# Patient Record
Sex: Male | Born: 1980 | Race: White | Hispanic: No | Marital: Married | Smoking: Former smoker
Health system: Southern US, Community
[De-identification: ages and names within clinical notes are randomized; demographics above are authoritative.]

## PROBLEM LIST (undated history)

## (undated) DIAGNOSIS — Z8489 Family history of other specified conditions: Secondary | ICD-10-CM

## (undated) DIAGNOSIS — K219 Gastro-esophageal reflux disease without esophagitis: Secondary | ICD-10-CM

## (undated) DIAGNOSIS — K5792 Diverticulitis of intestine, part unspecified, without perforation or abscess without bleeding: Secondary | ICD-10-CM

## (undated) HISTORY — PX: TONSILECTOMY, ADENOIDECTOMY, BILATERAL MYRINGOTOMY AND TUBES: SHX2538

## (undated) HISTORY — PX: TONSILLECTOMY: SUR1361

## (undated) HISTORY — DX: Diverticulitis of intestine, part unspecified, without perforation or abscess without bleeding: K57.92

---

## 2013-07-17 DIAGNOSIS — J309 Allergic rhinitis, unspecified: Secondary | ICD-10-CM | POA: Insufficient documentation

## 2014-08-15 DIAGNOSIS — M5489 Other dorsalgia: Secondary | ICD-10-CM | POA: Insufficient documentation

## 2014-08-15 DIAGNOSIS — G4700 Insomnia, unspecified: Secondary | ICD-10-CM | POA: Insufficient documentation

## 2015-07-16 ENCOUNTER — Encounter: Payer: Self-pay | Admitting: Emergency Medicine

## 2015-07-16 ENCOUNTER — Emergency Department: Payer: Managed Care, Other (non HMO)

## 2015-07-16 ENCOUNTER — Emergency Department
Admission: EM | Admit: 2015-07-16 | Discharge: 2015-07-16 | Disposition: A | Discharge status: Home / Self Care | Payer: Managed Care, Other (non HMO) | Attending: Emergency Medicine | Admitting: Emergency Medicine

## 2015-07-16 DIAGNOSIS — Y9389 Activity, other specified: Secondary | ICD-10-CM | POA: Diagnosis not present

## 2015-07-16 DIAGNOSIS — S93492A Sprain of other ligament of left ankle, initial encounter: Secondary | ICD-10-CM | POA: Diagnosis not present

## 2015-07-16 DIAGNOSIS — W109XXA Fall (on) (from) unspecified stairs and steps, initial encounter: Secondary | ICD-10-CM | POA: Diagnosis not present

## 2015-07-16 DIAGNOSIS — Y929 Unspecified place or not applicable: Secondary | ICD-10-CM | POA: Insufficient documentation

## 2015-07-16 DIAGNOSIS — Z87891 Personal history of nicotine dependence: Secondary | ICD-10-CM | POA: Insufficient documentation

## 2015-07-16 DIAGNOSIS — S99912A Unspecified injury of left ankle, initial encounter: Secondary | ICD-10-CM | POA: Diagnosis present

## 2015-07-16 DIAGNOSIS — Y999 Unspecified external cause status: Secondary | ICD-10-CM | POA: Diagnosis not present

## 2015-07-16 MED ORDER — MELOXICAM 15 MG PO TABS: 15.0000 mg | ORAL_TABLET | Freq: Every day | ORAL | Status: DC

## 2015-07-16 NOTE — Discharge Instructions (Signed)
Ankle Sprain °An ankle sprain is an injury to the strong, fibrous tissues (ligaments) that hold the bones of your ankle joint together.  °CAUSES °An ankle sprain is usually caused by a fall or by twisting your ankle. Ankle sprains most commonly occur when you step on the outer edge of your foot, and your ankle turns inward. People who participate in sports are more prone to these types of injuries.  °SYMPTOMS  °· Pain in your ankle. The pain may be present at rest or only when you are trying to stand or walk. °· Swelling. °· Bruising. Bruising may develop immediately or within 1 to 2 days after your injury. °· Difficulty standing or walking, particularly when turning corners or changing directions. °DIAGNOSIS  °Your caregiver will ask you details about your injury and perform a physical exam of your ankle to determine if you have an ankle sprain. During the physical exam, your caregiver will press on and apply pressure to specific areas of your foot and ankle. Your caregiver will try to move your ankle in certain ways. An X-ray exam may be done to be sure a bone was not broken or a ligament did not separate from one of the bones in your ankle (avulsion fracture).  °TREATMENT  °Certain types of braces can help stabilize your ankle. Your caregiver can make a recommendation for this. Your caregiver may recommend the use of medicine for pain. If your sprain is severe, your caregiver may refer you to a surgeon who helps to restore function to parts of your skeletal system (orthopedist) or a physical therapist. °HOME CARE INSTRUCTIONS  °· Apply ice to your injury for 1-2 days or as directed by your caregiver. Applying ice helps to reduce inflammation and pain. °· Put ice in a plastic bag. °· Place a towel between your skin and the bag. °· Leave the ice on for 15-20 minutes at a time, every 2 hours while you are awake. °· Only take over-the-counter or prescription medicines for pain, discomfort, or fever as directed by  your caregiver. °· Elevate your injured ankle above the level of your heart as much as possible for 2-3 days. °· If your caregiver recommends crutches, use them as instructed. Gradually put weight on the affected ankle. Continue to use crutches or a cane until you can walk without feeling pain in your ankle. °· If you have a plaster splint, wear the splint as directed by your caregiver. Do not rest it on anything harder than a pillow for the first 24 hours. Do not put weight on it. Do not get it wet. You may take it off to take a shower or bath. °· You may have been given an elastic bandage to wear around your ankle to provide support. If the elastic bandage is too tight (you have numbness or tingling in your foot or your foot becomes cold and blue), adjust the bandage to make it comfortable. °· If you have an air splint, you may blow more air into it or let air out to make it more comfortable. You may take your splint off at night and before taking a shower or bath. Wiggle your toes in the splint several times per day to decrease swelling. °SEEK MEDICAL CARE IF:  °· You have rapidly increasing bruising or swelling. °· Your toes feel extremely cold or you lose feeling in your foot. °· Your pain is not relieved with medicine. °SEEK IMMEDIATE MEDICAL CARE IF: °· Your toes are numb or blue. °·   You have severe pain that is increasing. °MAKE SURE YOU:  °· Understand these instructions. °· Will watch your condition. °· Will get help right away if you are not doing well or get worse. °  °This information is not intended to replace advice given to you by your health care provider. Make sure you discuss any questions you have with your health care provider. °  °Document Released: 03/25/2005 Document Revised: 04/15/2014 Document Reviewed: 04/06/2011 °Elsevier Interactive Patient Education ©2016 [employer] ° °Elastic Bandage and RICE °WHAT DOES AN ELASTIC BANDAGE DO? °Elastic bandages come in different shapes and sizes.  They generally provide support to your injury and reduce swelling while you are healing, but they can perform different functions. Your health care provider will help you to decide what is best for your protection, recovery, or rehabilitation following an injury. °WHAT ARE SOME GENERAL TIPS FOR USING AN ELASTIC BANDAGE? °· Use the bandage as directed by the maker of the bandage that you are using. °· Do not wrap the bandage too tightly. This may cut off the circulation in the arm or leg in the area below the bandage. °· If part of your body beyond the bandage becomes blue, numb, cold, swollen, or is more painful, your bandage is most likely too tight. If this occurs, remove your bandage and reapply it more loosely. °· See your health care provider if the bandage seems to be making your problems worse rather than better. °· An elastic bandage should be removed and reapplied every 3-4 hours or as directed by your health care provider. °WHAT IS RICE? °The routine care of many injuries includes rest, ice, compression, and elevation (RICE therapy).  °Rest °Rest is required to allow your body to heal. Generally, you can resume your routine activities when you are comfortable and have been given permission by your health care provider. °Ice °Icing your injury helps to keep the swelling down and it reduces pain. Do not apply ice directly to your skin. °· Put ice in a plastic bag. °· Place a towel between your skin and the bag. °· Leave the ice on for 20 minutes, 2-3 times per day. °Do this for as long as you are directed by your health care provider. °Compression °Compression helps to keep swelling down, gives support, and helps with discomfort. Compression may be done with an elastic bandage. °Elevation °Elevation helps to reduce swelling and it decreases pain. If possible, your injured area should be placed at or above the level of your heart or the center of your chest. °WHEN SHOULD I SEEK MEDICAL CARE? °You should seek  medical care if: °· You have persistent pain and swelling. °· Your symptoms are getting worse rather than improving. °These symptoms may indicate that further evaluation or further X-rays are needed. Sometimes, X-rays may not show a small broken bone (fracture) until a number of days later. Make a follow-up appointment with your health care provider. Ask when your X-ray results will be ready. Make sure that you get your X-ray results. °WHEN SHOULD I SEEK IMMEDIATE MEDICAL CARE? °You should seek immediate medical care if: °· You have a sudden onset of severe pain at or below the area of your injury. °· You develop redness or increased swelling around your injury. °· You have tingling or numbness at or below the area of your injury that does not improve after you remove the elastic bandage. °  °This information is not intended to replace advice given to you by your   health care provider. Make sure you discuss any questions you have with your health care provider. °  °Document Released: 09/14/2001 Document Revised: 12/14/2014 Document Reviewed: 11/08/2013 °Elsevier Interactive Patient Education ©2016 [employer] ° °Stirrup Ankle Brace °Stirrup ankle braces give support and help stabilize the ankle joint. They are rigid pieces of plastic or fiberglass that go up both sides of the lower leg with the bottom of the stirrup fitting comfortably under the bottom of the instep of the foot. It can be held on with Velcro straps or an elastic wrap. Stirrup ankle braces are used to support the ankle following mild or moderate sprains or strains, or fractures after cast removal.  °They can be easily removed or adjusted if there is swelling. The rigid brace shells are designed to fit the ankle comfortably and provide the needed medial/lateral stabilization. This brace can be easily worn with most athletic shoes. The brace liner is usually made of a soft, comfortable gel-like material. This gel fits the ankle well without causing  uncomfortable pressure points.  °IMPORTANCE OF ANKLE BRACES: °· The use of ankle bracing is effective in the prevention of ankle sprains. °· In athletes, the use of ankle bracing will offer protection and prevent further sprains. °· Research shows that a complete rehabilitation program needs to be included with external bracing. This includes range of motion and ankle strengthening exercises. Your caregivers will instruct you in this. °If you were given the brace today for a new injury, use the following home care instructions as a guide. °HOME CARE INSTRUCTIONS  °· Apply ice to the sore area for 15-20 minutes, 03-04 times per day while awake for the first 2 days. Put the ice in a plastic bag and place a towel between the bag of ice and your skin. Never place the ice pack directly on your skin. Be especially careful using ice on an elbow or knee or other bony area, such as your ankle, because icing for too long may damage the nerves which are close to the surface. °· Keep your leg elevated when possible to lessen swelling. °· Wear your splint until you are seen for a follow-up examination. Do not put weight on it. Do not get it wet. You may take it off to take a shower or bath. °· For Activity: Use crutches with non-weight bearing for 1 week. Then, you may walk on your ankle as instructed. Start gradually with weight bearing on the affected ankle. °· Continue to use crutches or a cane until you can stand on your ankle without causing pain. °· Wiggle your toes in the splint several times per day if you are able. °· The splint is too tight if you have numbness, tingling, or if your foot becomes cold and blue. Adjust the straps or elastic bandage to make it comfortable. °· Only take over-the-counter or prescription medicines for pain, discomfort, or fever as directed by your caregiver. °SEEK IMMEDIATE MEDICAL CARE IF:  °· You have increased bruising, swelling or pain. °· Your toes are blue or cold and loosening the  brace or wrap does not help. °· Your pain is not relieved with medicine. °MAKE SURE YOU:  °· Understand these instructions. °· Will watch your condition. °· Will get help right away if you are not doing well or get worse. °  °This information is not intended to replace advice given to you by your health care provider. Make sure you discuss any questions you have with your health care provider. °  °  Document Released: 01/24/2004 Document Revised: 06/17/2011 Document Reviewed: 10/25/2014 °Elsevier Interactive Patient Education ©2016 Elsevier Inc. ° °

## 2015-07-16 NOTE — ED Notes (Signed)
Pt states Injury to L ankle as he was carrying a box down the steps. Pt states pain to L ankle.

## 2015-07-16 NOTE — ED Provider Notes (Signed)
[facility] Emergency Department Provider Note  ____________________________________________  Time seen: Approximately 4:20 PM  I have reviewed the triage vital signs and the nursing notes.   HISTORY  Chief Complaint Ankle Injury    HPI Walter Coffey is a 35 y.o. male who presents emergency department complaining of left ankle pain. Patient states that he was coming down a flight of stairs carrying a box when he missed a step and hit the ground twisting his ankle in an inversion manner. Patient is complaining of pain to the lateral midfoot.Patient endorses some mild swelling to the lateral ankle. He states that it is painful to walk on. He denies any numbness or tingling in his toes. Patient denies any other injury or complaint at this time. He has taken 800 mg of ibuprofen prior to arrival as well as use ice. Patient states the pain is constant, moderate to severe with weightbearing.   History reviewed. No pertinent past medical history.  There are no active problems to display for this patient.   History reviewed. No pertinent past surgical history.  Current Outpatient Rx  Name  Route  Sig  Dispense  Refill  . meloxicam (MOBIC) 15 MG tablet   Oral   Take 1 tablet (15 mg total) by mouth daily.   30 tablet   0     Allergies Review of patient's allergies indicates no known allergies.  History reviewed. No pertinent family history.  Social History Social History  Substance Use Topics  . Smoking status: Former Games developer  . Smokeless tobacco: Never Used  . Alcohol Use: Yes     Review of Systems  Constitutional: No fever/chills Musculoskeletal:Positive for left ankle pain Skin: Negative for rash. Denies lacerations or abrasions to ankle. Neurological: Negative for headaches, focal weakness or numbness. 10-point ROS otherwise negative.  ____________________________________________   PHYSICAL EXAM:  VITAL SIGNS: ED Triage Vitals  Enc  Vitals Group     BP 07/16/15 1527 132/81 mmHg     Pulse Rate 07/16/15 1527 72     Resp 07/16/15 1527 18     Temp 07/16/15 1527 97.6 F (36.4 C)     Temp Source 07/16/15 1527 Oral     SpO2 07/16/15 1527 96 %     Weight 07/16/15 1527 230 lb (104.327 kg)     Height 07/16/15 1527  (1.803 m)     Head Cir --      Peak Flow --      Pain Score 07/16/15 1528 1     Pain Loc --      Pain Edu? --      Excl. in GC? --      Constitutional: Alert and oriented. Well appearing and in no acute distress. Cardiovascular: Normal rate, regular rhythm. Normal S1 and S2.  Good peripheral circulation. Respiratory: Normal respiratory effort without tachypnea or retractions. Lungs CTAB. Musculoskeletal: No deformity noted to left ankle on inspection. Patient does have edema noted to the anterior lateral aspect of the ankle joint. Patient is tender to palpation over the anterior talofibular ligament. No palpable abnormality. This is diffuse tenderness with no point tenderness. Limited range of motion to ankle due to pain. Dorsalis pedis pulse and sensation intact to foot. Neurologic:  Normal speech and language. No gross focal neurologic deficits are appreciated.  Skin:  Skin is warm, dry and intact. No rash noted. Psychiatric: Mood and affect are normal. Speech and behavior are normal. Patient exhibits appropriate insight and judgement.   ____________________________________________  LABS (all labs ordered are listed, but only abnormal results are displayed)  Labs Reviewed - No data to display ____________________________________________  EKG   ____________________________________________  RADIOLOGY Festus BarrenI, Imanie Darrow D Vasili Fok, personally viewed and evaluated these images (plain radiographs) as part of my medical decision making, as well as reviewing the written report by the radiologist.  Dg Ankle Complete Left  07/16/2015  CLINICAL DATA:  Twisted left ankle. EXAM: LEFT ANKLE COMPLETE - 3+ VIEW  COMPARISON:  None. FINDINGS: There is no evidence of fracture, dislocation, or joint effusion. There is no evidence of arthropathy or other focal bone abnormality. Soft tissues are unremarkable. IMPRESSION: Negative. Electronically Signed   By: Signa Kellaylor  Stroud M.D.   On: 07/16/2015 16:04    ____________________________________________    PROCEDURES  Procedure(s) performed:       Medications - No data to display   ____________________________________________   INITIAL IMPRESSION / ASSESSMENT AND PLAN / ED COURSE  Pertinent labs & imaging results that were available during my care of the patient were reviewed by me and considered in my medical decision making (see chart for details).  Patient's diagnosis is consistent with anterior talofibular ligament ankle sprain. Patient is given crutches and Ace bandage emergency department. He is instructed to use an ASO lace up ankle brace for further support.. Patient will be discharged home with prescriptions for meloxicam. Patient is to follow up with orthopedics if symptoms persist past this treatment course. Patient is given ED precautions to return to the ED for any worsening or new symptoms.     ____________________________________________  FINAL CLINICAL IMPRESSION(S) / ED DIAGNOSES  Final diagnoses:  Sprain of anterior talofibular ligament of left ankle, initial encounter      NEW MEDICATIONS STARTED DURING THIS VISIT:  New Prescriptions   MELOXICAM (MOBIC) 15 MG TABLET    Take 1 tablet (15 mg total) by mouth daily.        This chart was dictated using voice recognition software/Dragon. Despite best efforts to proofread, errors can occur which can change the meaning. Any change was purely unintentional.    Racheal PatchesJonathan D Derrius Furtick, PA-C 07/16/15 1651  Sharman CheekPhillip Stafford, MD 07/17/15 2350

## 2018-05-08 DIAGNOSIS — K219 Gastro-esophageal reflux disease without esophagitis: Secondary | ICD-10-CM | POA: Insufficient documentation

## 2018-10-05 ENCOUNTER — Other Ambulatory Visit: Payer: Self-pay | Admitting: Family Medicine

## 2018-10-05 ENCOUNTER — Ambulatory Visit
Admission: RE | Admit: 2018-10-05 | Discharge: 2018-10-05 | Disposition: A | Discharge status: Home / Self Care | Payer: PRIVATE HEALTH INSURANCE | Source: Ambulatory Visit | Attending: Family Medicine | Admitting: Family Medicine

## 2018-10-05 ENCOUNTER — Other Ambulatory Visit: Payer: Self-pay

## 2018-10-05 DIAGNOSIS — R1031 Right lower quadrant pain: Secondary | ICD-10-CM | POA: Diagnosis present

## 2018-10-05 DIAGNOSIS — R1909 Other intra-abdominal and pelvic swelling, mass and lump: Secondary | ICD-10-CM

## 2018-10-05 MED ORDER — IOHEXOL 300 MG/ML  SOLN
100.0000 mL | Freq: Once | INTRAMUSCULAR | Status: AC | PRN
  Administered 2018-10-05: 100 mL via INTRAVENOUS

## 2018-11-02 ENCOUNTER — Encounter: Payer: Self-pay | Admitting: General Surgery

## 2018-11-02 ENCOUNTER — Ambulatory Visit: Payer: PRIVATE HEALTH INSURANCE | Admitting: General Surgery

## 2018-11-02 ENCOUNTER — Other Ambulatory Visit: Payer: Self-pay

## 2018-11-02 VITALS — BP 143/105 | HR 85 | Temp 97.9°F | Resp 18 | Ht 71.0 in | Wt 229.2 lb

## 2018-11-02 DIAGNOSIS — K4090 Unilateral inguinal hernia, without obstruction or gangrene, not specified as recurrent: Secondary | ICD-10-CM | POA: Insufficient documentation

## 2018-11-02 NOTE — Progress Notes (Signed)
Patient ID: Walter Coffey, male   DOB: 17-Jun-1980, 37 y.o.   MRN: [id number]  Chief Complaint  Patient presents with  . New Patient (Initial Visit)    Right Inguinal Hernia    HPI Walter Coffey is a 38 y.o. male.   He has been referred by his primary care provider, Dr. Frazier Richards, for further evaluation of a right inguinal hernia.  Walter Coffey states that he has noticed some discomfort in his right groin since about January.  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  In June, he had significantly worsening pain accompanied by loose stool.  He underwent a CT scan that showed diverticulitis.  He was treated with oral antibiotics.  The CT scan also demonstrated a right inguinal hernia.  He was referred to general surgery and saw Dr. Peyton Najjar at the [place] [facility].  Walter Coffey states that he did not get "warm fuzzies" from his interaction there and wanted to seek a second opinion.  He states that he has not noticed a bulge in his groin.  He has not had any nausea or vomiting.  No symptoms of obstruction, incarceration, or strangulation.   Past Medical History:  Diagnosis Date  . Diverticulitis     Past Surgical History:  Procedure Laterality Date  . TONSILECTOMY, ADENOIDECTOMY, BILATERAL MYRINGOTOMY AND TUBES      Family History  Problem Relation Age of Onset  . Breast cancer Mother   . Diabetes Mother   . Severe combined immunodeficiency Father     Social History Social History   Tobacco Use  . Smoking status: Former Smoker    Quit date: 2004    Years since quitting: 16.5  . Smokeless tobacco: Never Used  Substance Use Topics  . Alcohol use: Yes  . Drug use: No    No Known Allergies  Current Outpatient Medications  Medication Sig Dispense Refill  . pantoprazole (PROTONIX) 40 MG tablet Take by mouth.     No current facility-administered medications for this visit.     Review of  Systems Review of Systems  All other systems reviewed and are negative.   Blood pressure (!) 143/105, pulse 85, temperature 97.9 F (36.6 C), temperature source Temporal, resp. rate 18, height 5' 11"  (1.803 m), weight 229 lb 3.2 oz (104 kg), SpO2 92 %.  Physical Exam Physical Exam Vitals signs reviewed. Exam conducted with a chaperone present.  Constitutional:      General: He is not in acute distress.    Appearance: Normal appearance. He is obese.  HENT:     Head: Normocephalic and atraumatic.     Nose:     Comments: Covered with a mask secondary to COVID-19 precautions    Mouth/Throat:     Comments: Covered with a mask secondary to COVID-19 precautions Eyes:     General: No scleral icterus.       Right eye: No discharge.        Left eye: No discharge.  Neck:     Musculoskeletal: Normal range of motion.     Comments: No thyromegaly or thyroid masses palpated Cardiovascular:     Rate and Rhythm: Normal rate and regular rhythm.     Pulses: Normal pulses.  Pulmonary:     Effort: Pulmonary effort is normal.     Breath sounds: Normal breath sounds.  Abdominal:     General: Bowel sounds are normal. There is no  distension.     Palpations: Abdomen is soft.  Genitourinary:      Comments: Easily reducible right inguinal hernia present. Musculoskeletal: Normal range of motion.        General: No swelling.  Lymphadenopathy:     Cervical: No cervical adenopathy.  Skin:    General: Skin is warm and dry.  Neurological:     General: No focal deficit present.     Mental Status: He is alert and oriented to person, place, and time.  Psychiatric:        Mood and Affect: Mood normal.     Data Reviewed I personally reviewed the patient's CT scan.  I agree with the radiologist interpretation regarding a fat-containing right inguinal hernia.  He also did have acute diverticulitis on the scan.    Assessment and Plan: This is a 38 year old male who works in the [employer]  He has had an increasingly symptomatic right inguinal hernia.  He has already met with another surgeon and actually has surgery scheduled, however he sought a second opinion.  He is very concerned about the possibility of an open operation.  I told him that I do not routinely perform laparoscopic hernia repairs.  In light of his recent diverticulitis, as well, this may not be the best time to approach this from a transabdominal approach.  One of my partners has more experience with the total extraperitoneal approach.  Walter Coffey is familiar with Dr. Dahlia Byes as his wife underwent an appendectomy under his care.  We will arrange a consultation between Walter Coffey and Dr. Dahlia Byes to discuss any additional surgical options.  I will see him on an as-needed basis     Walter Coffey 11/02/2018, 5:09 PM

## 2018-11-02 NOTE — Patient Instructions (Addendum)
Please see your follow up appointment listed below.    Inguinal Hernia, Adult An inguinal hernia is when fat or your intestines push through a weak spot in a muscle where your leg meets your lower belly (groin). This causes a rounded lump (bulge). This kind of hernia could also be:  In your scrotum, if you are male.  In folds of skin around your vagina, if you are male. There are three types of inguinal hernias. These include:  Hernias that can be pushed back into the belly (are reducible). This type rarely causes pain.  Hernias that cannot be pushed back into the belly (are incarcerated).  Hernias that cannot be pushed back into the belly and lose their blood supply (are strangulated). This type needs emergency surgery. If you do not have symptoms, you may not need treatment. If you have symptoms or a large hernia, you may need surgery. Follow these instructions at home: Lifestyle  Do these things if told by your doctor so you do not have trouble pooping (constipation): ? Drink enough fluid to keep your pee (urine) pale yellow. ? Eat foods that have a lot of fiber. These include fresh fruits and vegetables, whole grains, and beans. ? Limit foods that are high in fat and processed sugars. These include foods that are fried or sweet. ? Take medicine for trouble pooping.  Avoid lifting heavy objects.  Avoid standing for long amounts of time.  Do not use any products that contain nicotine or tobacco. These include cigarettes and e-cigarettes. If you need help quitting, ask your doctor.  Stay at a healthy weight. General instructions  You may try to push your hernia in by very gently pressing on it when you are lying down. Do not try to force the bulge back in if it will not push in easily.  Watch your hernia for any changes in shape, size, or color. Tell your doctor if you see any changes.  Take over-the-counter and prescription medicines only as told by your doctor.  Keep all  follow-up visits as told by your doctor. This is important. Contact a doctor if:  You have a fever.  You have new symptoms.  Your symptoms get worse. Get help right away if:  The area where your leg meets your lower belly has: ? Pain that gets worse suddenly. ? A bulge that gets bigger suddenly, and it does not get smaller after that. ? A bulge that turns red or purple. ? A bulge that is painful when you touch it.  You are a man, and your scrotum: ? Suddenly feels painful. ? Suddenly changes in size.  You cannot push the hernia in by very gently pressing on it when you are lying down. Do not try to force the bulge back in if it will not push in easily.  You feel sick to your stomach (nauseous), and that feeling does not go away.  You throw up (vomit), and that keeps happening.  You have a fast heartbeat.  You cannot poop (have a bowel movement) or pass gas. These symptoms may be an emergency. Do not wait to see if the symptoms will go away. Get medical help right away. Call your local emergency services (911 in the [place] Summary  An inguinal hernia is when fat or your intestines push through a weak spot in a muscle where your leg meets your lower belly (groin). This causes a rounded lump (bulge).  If you do not have symptoms, you may not  need treatment. If you have symptoms or a large hernia, you may need surgery.  Avoid lifting heavy objects. Also avoid standing for long amounts of time.  Do not try to force the bulge back in if it will not push in easily. This information is not intended to replace advice given to you by your health care provider. Make sure you discuss any questions you have with your health care provider. Document Released: 04/25/2006 Document Revised: 04/26/2017 Document Reviewed: 12/25/2016 Elsevier Patient Education  2020 [employer].

## 2018-11-03 ENCOUNTER — Ambulatory Visit: Payer: PRIVATE HEALTH INSURANCE | Admitting: General Surgery

## 2018-11-11 ENCOUNTER — Ambulatory Visit (INDEPENDENT_AMBULATORY_CARE_PROVIDER_SITE_OTHER): Payer: PRIVATE HEALTH INSURANCE | Admitting: Surgery

## 2018-11-11 ENCOUNTER — Encounter: Payer: Self-pay | Admitting: Surgery

## 2018-11-11 ENCOUNTER — Encounter: Payer: Self-pay | Admitting: *Deleted

## 2018-11-11 ENCOUNTER — Other Ambulatory Visit: Payer: Self-pay

## 2018-11-11 VITALS — BP 136/97 | HR 72 | Temp 97.5°F | Resp 18 | Ht 71.0 in | Wt 226.6 lb

## 2018-11-11 DIAGNOSIS — K4090 Unilateral inguinal hernia, without obstruction or gangrene, not specified as recurrent: Secondary | ICD-10-CM | POA: Diagnosis not present

## 2018-11-11 NOTE — Progress Notes (Signed)
Patient was seen by Dr. Dahlia Byes in the office today and has discussed robotic inguinal hernia repair.   The patient wishes to discuss this with his wife and call the office back to schedule. He is thinking maybe having this done in September.   Patient was provided with phone interview and COVID testing instructions today.   We will await patient's call regarding surgery scheduling.

## 2018-11-12 ENCOUNTER — Encounter: Payer: Self-pay | Admitting: Surgery

## 2018-11-12 NOTE — Progress Notes (Signed)
Patient ID: Walter Coffey, male   DOB: 30-Sep-1980, 38 y.o.   MRN: [id number]  HPI Walter Coffey is a 38 y.o. male with a known history of a right inguinal hernia that is been present for 7 months or so.  He reports intermittent right inguinal discomfort/pain that is mild to moderate intensity, dull and worsening with Valsalva.  He actually reports that he first felt when he was lifting some equipment and cases of water.  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  He recently had an episode of diverticulitis and at that time a CT scan of the abdomen pelvis was performed.  I personally reviewed there is no evidence of perforation or abscess but there is some diverticulitis and there is also evidence of a right inguinal hernia. Normal CBC and BMP He states that his diverticulitis is completely clear and he does not have any left lower quadrant pain anymore.  He is very interested in a robotic assisted laparoscopic hernia repair   HPI  Past Medical History:  Diagnosis Date  . Diverticulitis     Past Surgical History:  Procedure Laterality Date  . TONSILECTOMY, ADENOIDECTOMY, BILATERAL MYRINGOTOMY AND TUBES      Family History  Problem Relation Age of Onset  . Breast cancer Mother   . Diabetes Mother   . Severe combined immunodeficiency Father     Social History Social History   Tobacco Use  . Smoking status: Former Smoker    Quit date: 2004    Years since quitting: 16.6  . Smokeless tobacco: Never Used  Substance Use Topics  . Alcohol use: Yes  . Drug use: No    No Known Allergies  Current Outpatient Medications  Medication Sig Dispense Refill  . pantoprazole (PROTONIX) 40 MG tablet Take by mouth.     No current facility-administered medications for this visit.      Review of Systems Full ROS  was asked and was negative except for the information on the HPI  Physical Exam Blood pressure (!) 136/97, pulse 72, temperature (!) 97.5 F (36.4 C), temperature source  Temporal, resp. rate 18, height 5\' 11"  (1.803 m), weight 226 lb 9.6 oz (102.8 kg), SpO2 97 %. CONSTITUTIONAL: NAD EYES: Pupils are equal, round, and reactive to light, Sclera are non-icteric. EARS, NOSE, MOUTH AND THROAT: The oropharynx is clear. The oral mucosa is pink and moist. Hearing is intact to voice. LYMPH NODES:  Lymph nodes in the neck are normal. RESPIRATORY:  Lungs are clear. There is normal respiratory effort, with equal breath sounds bilaterally, and without pathologic use of accessory muscles. CARDIOVASCULAR: Heart is regular without murmurs, gallops, or rubs. GI: The abdomen is  soft, nontender, and nondistended. There are no palpable masses. There is no hepatosplenomegaly. There are normal bowel sounds in all quadrants. Reducible right inguinal hernia without peritonitis GU: Rectal deferred.   MUSCULOSKELETAL: Normal muscle strength and tone. No cyanosis or edema.   SKIN: Turgor is good and there are no pathologic skin lesions or ulcers. NEUROLOGIC: Motor and sensation is grossly normal. Cranial nerves are grossly intact. PSYCH:  Oriented to person, place and time. Affect is normal.  Data Reviewed  I have personally reviewed the patient's imaging, laboratory findings and medical records.    Assessment/Plan 38 year old male with a symptomatic right inguinal hernia.  I definitely recommend repair.  We had an extensive discussion about different approaches for inguinal hernia repair including laparoscopic T AP, laparoscopic TEP, open hernia repair and robotic assisted T  AP repair. Had an extensive discussion and educational conversation with the patient regarding different approaches and rationale for each method.  I understand that he did have episode of diverticulitis more than a month ago but that does not necessarily preclude a laparoscopic approach.  I did explain to him that as long as the diverticulitis was not acute he will be reasonable to attempt a robotic T AP repair.   He is willing to proceed and currently he wants to do there is approximately in September which will give us much more time to make sure that the diverticulitis has cleared.  Time spent with the patient was 45 minutes, with more than 50% of the time spent in face-to-face education, counseling and care coordination.     Sterling Bigiego Pabon, MD FACS General Surgeon 11/12/2018, 1:07 PM

## 2018-11-24 ENCOUNTER — Other Ambulatory Visit: Payer: PRIVATE HEALTH INSURANCE

## 2018-11-26 ENCOUNTER — Other Ambulatory Visit: Admission: RE | Admit: 2018-11-26 | Payer: PRIVATE HEALTH INSURANCE | Source: Ambulatory Visit

## 2018-11-30 ENCOUNTER — Ambulatory Visit: Admit: 2018-11-30 | Payer: BLUE CROSS/BLUE SHIELD | Admitting: General Surgery

## 2018-11-30 SURGERY — REPAIR, HERNIA, INGUINAL, ADULT
Anesthesia: General | Laterality: Right

## 2019-04-14 ENCOUNTER — Encounter: Payer: Self-pay | Admitting: Surgery

## 2019-04-14 ENCOUNTER — Other Ambulatory Visit: Payer: Self-pay

## 2019-04-14 ENCOUNTER — Ambulatory Visit (INDEPENDENT_AMBULATORY_CARE_PROVIDER_SITE_OTHER): Payer: PRIVATE HEALTH INSURANCE | Admitting: Surgery

## 2019-04-14 VITALS — BP 157/111 | HR 70 | Temp 97.3°F | Ht 71.5 in | Wt 225.4 lb

## 2019-04-14 DIAGNOSIS — K4090 Unilateral inguinal hernia, without obstruction or gangrene, not specified as recurrent: Secondary | ICD-10-CM | POA: Diagnosis not present

## 2019-04-14 DIAGNOSIS — K5732 Diverticulitis of large intestine without perforation or abscess without bleeding: Secondary | ICD-10-CM

## 2019-04-14 NOTE — Progress Notes (Signed)
Outpatient Surgical Follow Up  04/14/2019  Walter Coffey is an 39 y.o. male.   Chief Complaint  Patient presents with  . Follow-up    discuss surgery for inguinal hernia repair for mid jan.    HPI: Walter Coffey is a 39 year old male well-known to me with prior history of symptomatic right inguinal hernia.  He is supposed to have this done last few months but due to the pandemic and other personal circumstances he decided to hold off.  In the interim he has developed 2 episodes of noncomplicated diverticulitis that were able to subside spontaneously after 2 days of bowel rest and hydration.  Did not require antibiotics and he did not require admissions.  He did have prior episode that occurred on June last year and a CT scan was performed at that time.  I have personally reviewed and there was evidence of some diverticulitis without complications.  He describes that his pain from his diverticulitis is mainly in the suprapubic area and interferes with some urination.  Currently the patient is symptom-free.  He has tried fiber and some diet modifications.  He does have a history of an aunt with diverticulitis.  He wants to discuss further about the diverticulitis as well as his inguinal hernia.  He denies any recent fevers chills no Covid exposures no pulmonary symptoms no weight loss.  Past Medical History:  Diagnosis Date  . Diverticulitis     Past Surgical History:  Procedure Laterality Date  . TONSILECTOMY, ADENOIDECTOMY, BILATERAL MYRINGOTOMY AND TUBES      Family History  Problem Relation Age of Onset  . Breast cancer Mother   . Diabetes Mother   . Severe combined immunodeficiency Father     Social History:  reports that he quit smoking about 17 years ago. He has never used smokeless tobacco. He reports current alcohol use. He reports that he does not use drugs.  Allergies: No Known Allergies  Medications reviewed.    ROS Full ROS performed and is otherwise negative other than  what is stated in HPI   BP (!) 157/111   Pulse 70   Temp (!) 97.3 F (36.3 C) (Temporal)   Ht 5' 11.5" (1.816 m)   Wt 225 lb 6.4 oz (102.2 kg)   SpO2 97%   BMI 31.00 kg/m   Physical Exam Vitals and nursing note reviewed. Exam conducted with a chaperone present.  Constitutional:      General: He is not in acute distress.    Appearance: He is normal weight.  Eyes:     General: No scleral icterus.       Right eye: No discharge.        Left eye: No discharge.  Cardiovascular:     Rate and Rhythm: Normal rate and regular rhythm.  Pulmonary:     Effort: Pulmonary effort is normal. No respiratory distress.     Breath sounds: Normal breath sounds. No stridor.  Abdominal:     General: Bowel sounds are normal. There is no distension.     Palpations: Abdomen is soft.     Tenderness: There is no abdominal tenderness. There is no guarding.     Hernia: A hernia is present.     Comments: Reducible right inguinal hernia mildly tender to palpation.  No peritonitis  Musculoskeletal:        General: No swelling. Normal range of motion.     Cervical back: Normal range of motion and neck supple. No rigidity.  Skin:  General: Skin is warm and dry.     Capillary Refill: Capillary refill takes less than 2 seconds.  Neurological:     General: No focal deficit present.     Mental Status: He is alert and oriented to person, place, and time.  Psychiatric:        Mood and Affect: Mood normal.        Behavior: Behavior normal.        Thought Content: Thought content normal.        Judgment: Judgment normal.     Assessment/Plan: 39 year old male with symptomatic right inguinal hernia in need for repair.  I do think that he is a good candidate approach.  He understands that if we find a contralateral defect we will go ahead and fix it in the same operative setting.  Regarding diverticulitis it seems that he had recurrent noncomplicated episodes.  Discussed with him about the importance of  high-fiber diet and hydration.  I do not think that he needs any imaging studies at this time or any other further work-up.  He understands that if he had an acute attack we may have to perform another imaging study.  Schedule his inguinal hernia at his convenience.  He is trying to get also the Covid vaccination and will like to have that before surgery scheduled.  Procedure discussed with the patient once again.  Risk benefit of possible medications he understands and wished to proceed.   Greater than 50% of the 40 minutes  visit was spent in counseling/coordination of care   Sterling Big, MD [facility] General Surgeon

## 2019-04-14 NOTE — Patient Instructions (Addendum)
Our surgery scheduler will contact you within the next 24-48 hours to discuss prep for surgery and to discuss the date of surgery. Please have the BLUE sheet available when our surgery scheduler contacts you. If you have any questions or concerns, please do not hesitate to contact our office.   Diverticulitis  Diverticulitis is infection or inflammation of small pouches (diverticula) in the colon that form due to a condition called diverticulosis. Diverticula can trap stool (feces) and bacteria, causing infection and inflammation. Diverticulitis may cause severe stomach pain and diarrhea. It may lead to tissue damage in the colon that causes bleeding. The diverticula may also burst (rupture) and cause infected stool to enter other areas of the abdomen. Complications of diverticulitis can include:  Bleeding.  Severe infection.  Severe pain.  Rupture (perforation) of the colon.  Blockage (obstruction) of the colon. What are the causes? This condition is caused by stool becoming trapped in the diverticula, which allows bacteria to grow in the diverticula. This leads to inflammation and infection. What increases the risk? You are more likely to develop this condition if:  You have diverticulosis. The risk for diverticulosis increases if: ? You are overweight or obese. ? You use tobacco products. ? You do not get enough exercise.  You eat a diet that does not include enough fiber. High-fiber foods include fruits, vegetables, beans, nuts, and whole grains. What are the signs or symptoms? Symptoms of this condition may include:  Pain and tenderness in the abdomen. The pain is normally located on the left side of the abdomen, but it may occur in other areas.  Fever and chills.  Bloating.  Cramping.  Nausea.  Vomiting.  Changes in bowel routines.  Blood in your stool. How is this diagnosed? This condition is diagnosed based on:  Your medical history.  A physical  exam.  Tests to make sure there is nothing else causing your condition. These tests may include: ? Blood tests. ? Urine tests. ? Imaging tests of the abdomen, including X-rays, ultrasounds, MRIs, or CT scans. How is this treated? Most cases of this condition are mild and can be treated at home. Treatment may include:  Taking over-the-counter pain medicines.  Following a clear liquid diet.  Taking antibiotic medicines by mouth.  Rest. More severe cases may need to be treated at a hospital. Treatment may include:  Not eating or drinking.  Taking prescription pain medicine.  Receiving antibiotic medicines through an IV tube.  Receiving fluids and nutrition through an IV tube.  Surgery. When your condition is under control, your health care provider may recommend that you have a colonoscopy. This is an exam to look at the entire large intestine. During the exam, a lubricated, bendable tube is inserted into the anus and then passed into the rectum, colon, and other parts of the large intestine. A colonoscopy can show how severe your diverticula are and whether something else may be causing your symptoms. Follow these instructions at home: Medicines  Take over-the-counter and prescription medicines only as told by your health care provider. These include fiber supplements, probiotics, and stool softeners.  If you were prescribed an antibiotic medicine, take it as told by your health care provider. Do not stop taking the antibiotic even if you start to feel better.  Do not drive or use heavy machinery while taking prescription pain medicine. General instructions   Follow a full liquid diet or another diet as directed by your health care provider. After your symptoms improve,  your health care provider may tell you to change your diet. He or she may recommend that you eat a diet that contains at least 25 g (25 grams) of fiber daily. Fiber makes it easier to pass stool. Healthy sources  of fiber include: ? Berries. One cup contains 4-8 grams of fiber. ? Beans or lentils. One half cup contains 5-8 grams of fiber. ? Green vegetables. One cup contains 4 grams of fiber.  Exercise for at least 30 minutes, 3 times each week. You should exercise hard enough to raise your heart rate and break a sweat.  Keep all follow-up visits as told by your health care provider. This is important. You may need a colonoscopy. Contact a health care provider if:  Your pain does not improve.  You have a hard time drinking or eating food.  Your bowel movements do not return to normal. Get help right away if:  Your pain gets worse.  Your symptoms do not get better with treatment.  Your symptoms suddenly get worse.  You have a fever.  You vomit more than one time.  You have stools that are bloody, black, or tarry. Summary  Diverticulitis is infection or inflammation of small pouches (diverticula) in the colon that form due to a condition called diverticulosis. Diverticula can trap stool (feces) and bacteria, causing infection and inflammation.  You are at higher risk for this condition if you have diverticulosis and you eat a diet that does not include enough fiber.  Most cases of this condition are mild and can be treated at home. More severe cases may need to be treated at a hospital.  When your condition is under control, your health care provider may recommend that you have an exam called a colonoscopy. This exam can show how severe your diverticula are and whether something else may be causing your symptoms. This information is not intended to replace advice given to you by your health care provider. Make sure you discuss any questions you have with your health care provider. Document Revised: 03/07/2017 Document Reviewed: 04/27/2016 Elsevier Patient Education  2020 [employer]    Inguinal Hernia, Adult An inguinal hernia develops when fat or the intestines push through a  weak spot in a muscle where your leg meets your lower abdomen (groin). This creates a bulge. This kind of hernia could also be:  In your scrotum, if you are male.  In folds of skin around your vagina, if you are male. There are three types of inguinal hernias:  Hernias that can be pushed back into the abdomen (are reducible). This type rarely causes pain.  Hernias that are not reducible (are incarcerated).  Hernias that are not reducible and lose their blood supply (are strangulated). This type of hernia requires emergency surgery. What are the causes? This condition is caused by having a weak spot in the muscles or tissues in the groin. This weak spot develops over time. The hernia may poke through the weak spot when you suddenly strain your lower abdominal muscles, such as when you:  Lift a heavy object.  Strain to have a bowel movement. Constipation can lead to straining.  Cough. What increases the risk? This condition is more likely to develop in:  Men.  Pregnant women.  People who: ? Are overweight. ? Work in jobs that require long periods of standing or heavy lifting. ? Have had an inguinal hernia before. ? Smoke or have lung disease. These factors can lead to long-lasting (chronic) coughing.  What are the signs or symptoms? Symptoms may depend on the size of the hernia. Often, a small inguinal hernia has no symptoms. Symptoms of a larger hernia may include:  A lump in the groin area. This is easier to see when standing. It might not be visible when lying down.  Pain or burning in the groin. This may get worse when lifting, straining, or coughing.  A dull ache or a feeling of pressure in the groin.  In men, an unusual lump in the scrotum. Symptoms of a strangulated inguinal hernia may include:  A bulge in your groin that is very painful and tender to the touch.  A bulge that turns red or purple.  Fever, nausea, and vomiting.  Inability to have a bowel movement  or to pass gas. How is this diagnosed? This condition is diagnosed based on your symptoms, your medical history, and a physical exam. Your health care provider may feel your groin area and ask you to cough. How is this treated? Treatment depends on the size of your hernia and whether you have symptoms. If you do not have symptoms, your health care provider may have you watch your hernia carefully and have you come in for follow-up visits. If your hernia is large or if you have symptoms, you may need surgery to repair the hernia. Follow these instructions at home: Lifestyle  Avoid lifting heavy objects.  Avoid standing for long periods of time.  Do not use any products that contain nicotine or tobacco, such as cigarettes and e-cigarettes. If you need help quitting, ask your health care provider.  Maintain a healthy weight. Preventing constipation  Take actions to prevent constipation. Constipation leads to straining with bowel movements, which can make a hernia worse or cause a hernia repair to break down. Your health care provider may recommend that you: ? Drink enough fluid to keep your urine pale yellow. ? Eat foods that are high in fiber, such as fresh fruits and vegetables, whole grains, and beans. ? Limit foods that are high in fat and processed sugars, such as fried or sweet foods. ? Take an over-the-counter or prescription medicine for constipation. General instructions  You may try to push the hernia back in place by very gently pressing on it while lying down. Do not try to force the bulge back in if it will not push in easily.  Watch your hernia for any changes in shape, size, or color. Get help right away if you notice any changes.  Take over-the-counter and prescription medicines only as told by your health care provider.  Keep all follow-up visits as told by your health care provider. This is important. Contact a health care provider if:  You have a fever.  You develop  new symptoms.  Your symptoms get worse. Get help right away if:  You have pain in your groin that suddenly gets worse.  You have a bulge in your groin that: ? Suddenly gets bigger and does not get smaller. ? Becomes red or purple or painful to the touch.  You are a man and you have a sudden pain in your scrotum, or the size of your scrotum suddenly changes.  You cannot push the hernia back in place by very gently pressing on it when you are lying down. Do not try to force the bulge back in if it will not push in easily.  You have nausea or vomiting that does not go away.  You have a fast heartbeat.  You cannot have a bowel movement or pass gas. These symptoms may represent a serious problem that is an emergency. Do not wait to see if the symptoms will go away. Get medical help right away. Call your local emergency services (911 in the [place] Summary  An inguinal hernia develops when fat or the intestines push through a weak spot in a muscle where your leg meets your lower abdomen (groin).  This condition is caused by having a weak spot in muscles or tissue in your groin.  Symptoms may depend on the size of the hernia, and they may include pain or swelling in your groin. A small inguinal hernia often has no symptoms.  Treatment may not be needed if you do not have symptoms. If you have symptoms or a large hernia, you may need surgery to repair the hernia.  Avoid lifting heavy objects. Also avoid standing for long amounts of time. This information is not intended to replace advice given to you by your health care provider. Make sure you discuss any questions you have with your health care provider. Document Revised: 04/26/2017 Document Reviewed: 12/25/2016 Elsevier Patient Education  [place].

## 2019-04-14 NOTE — H&P (View-Only) (Signed)
Outpatient Surgical Follow Up  04/14/2019  Walter Coffey is an 39 y.o. male.   Chief Complaint  Patient presents with  . Follow-up    discuss surgery for inguinal hernia repair for mid jan.    HPI: Walter Coffey is a 39-year-old male well-known to me with prior history of symptomatic right inguinal hernia.  He is supposed to have this done last few months but due to the pandemic and other personal circumstances he decided to hold off.  In the interim he has developed 2 episodes of noncomplicated diverticulitis that were able to subside spontaneously after 2 days of bowel rest and hydration.  Did not require antibiotics and he did not require admissions.  He did have prior episode that occurred on June last year and a CT scan was performed at that time.  I have personally reviewed and there was evidence of some diverticulitis without complications.  He describes that his pain from his diverticulitis is mainly in the suprapubic area and interferes with some urination.  Currently the patient is symptom-free.  He has tried fiber and some diet modifications.  He does have a history of an aunt with diverticulitis.  He wants to discuss further about the diverticulitis as well as his inguinal hernia.  He denies any recent fevers chills no Covid exposures no pulmonary symptoms no weight loss.  Past Medical History:  Diagnosis Date  . Diverticulitis     Past Surgical History:  Procedure Laterality Date  . TONSILECTOMY, ADENOIDECTOMY, BILATERAL MYRINGOTOMY AND TUBES      Family History  Problem Relation Age of Onset  . Breast cancer Mother   . Diabetes Mother   . Severe combined immunodeficiency Father     Social History:  reports that he quit smoking about 17 years ago. He has never used smokeless tobacco. He reports current alcohol use. He reports that he does not use drugs.  Allergies: No Known Allergies  Medications reviewed.    ROS Full ROS performed and is otherwise negative other than  what is stated in HPI   BP (!) 157/111   Pulse 70   Temp (!) 97.3 F (36.3 C) (Temporal)   Ht 5' 11.5" (1.816 m)   Wt 225 lb 6.4 oz (102.2 kg)   SpO2 97%   BMI 31.00 kg/m   Physical Exam Vitals and nursing note reviewed. Exam conducted with a chaperone present.  Constitutional:      General: He is not in acute distress.    Appearance: He is normal weight.  Eyes:     General: No scleral icterus.       Right eye: No discharge.        Left eye: No discharge.  Cardiovascular:     Rate and Rhythm: Normal rate and regular rhythm.  Pulmonary:     Effort: Pulmonary effort is normal. No respiratory distress.     Breath sounds: Normal breath sounds. No stridor.  Abdominal:     General: Bowel sounds are normal. There is no distension.     Palpations: Abdomen is soft.     Tenderness: There is no abdominal tenderness. There is no guarding.     Hernia: A hernia is present.     Comments: Reducible right inguinal hernia mildly tender to palpation.  No peritonitis  Musculoskeletal:        General: No swelling. Normal range of motion.     Cervical back: Normal range of motion and neck supple. No rigidity.  Skin:      General: Skin is warm and dry.     Capillary Refill: Capillary refill takes less than 2 seconds.  Neurological:     General: No focal deficit present.     Mental Status: He is alert and oriented to person, place, and time.  Psychiatric:        Mood and Affect: Mood normal.        Behavior: Behavior normal.        Thought Content: Thought content normal.        Judgment: Judgment normal.     Assessment/Plan: 39 year old male with symptomatic right inguinal hernia in need for repair.  I do think that he is a good candidate approach.  He understands that if we find a contralateral defect we will go ahead and fix it in the same operative setting.  Regarding diverticulitis it seems that he had recurrent noncomplicated episodes.  Discussed with him about the importance of  high-fiber diet and hydration.  I do not think that he needs any imaging studies at this time or any other further work-up.  He understands that if he had an acute attack we may have to perform another imaging study.  Schedule his inguinal hernia at his convenience.  He is trying to get also the Covid vaccination and will like to have that before surgery scheduled.  Procedure discussed with the patient once again.  Risk benefit of possible medications he understands and wished to proceed.   Greater than 50% of the 40 minutes  visit was spent in counseling/coordination of care   Sterling Big, MD [facility] General Surgeon

## 2019-04-21 ENCOUNTER — Telehealth: Payer: Self-pay | Admitting: Surgery

## 2019-04-21 NOTE — Telephone Encounter (Signed)
Pt has been advised of pre admission date/time, Covid Testing date and Surgery date.  Surgery Date: 04/30/19 Preadmission Testing Date: 04/27/19 Covid Testing Date: 04/28/19 - patient advised to go to the [facility] ([address] [place])  [employer] Video sent via [employer] [facility] and [employer].  Patient has been made aware to call [phone], between 1-3:00pm the day before surgery, to find out what time to arrive.

## 2019-04-27 ENCOUNTER — Encounter
Admission: RE | Admit: 2019-04-27 | Discharge: 2019-04-27 | Disposition: A | Discharge status: Home / Self Care | Payer: PRIVATE HEALTH INSURANCE | Source: Ambulatory Visit | Attending: Surgery | Admitting: Surgery

## 2019-04-27 DIAGNOSIS — Z20822 Contact with and (suspected) exposure to covid-19: Secondary | ICD-10-CM | POA: Insufficient documentation

## 2019-04-27 DIAGNOSIS — Z01812 Encounter for preprocedural laboratory examination: Secondary | ICD-10-CM | POA: Diagnosis not present

## 2019-04-27 HISTORY — DX: Family history of other specified conditions: Z84.89

## 2019-04-27 HISTORY — DX: Gastro-esophageal reflux disease without esophagitis: K21.9

## 2019-04-27 NOTE — Patient Instructions (Addendum)
Your procedure is scheduled on: 04-30-19 FRIDAY Report to Same Day Surgery 2nd floor medical mall [facility] Entrance-take elevator on left to 2nd floor.  Check in with surgery information desk.) To find out your arrival time please call [phone] between 1PM - 3PM on 04-29-19 THURSDAY  Remember: Instructions that are not followed completely may result in serious medical risk, up to and including death, or upon the discretion of your surgeon and anesthesiologist your surgery may need to be rescheduled.    _x___ 1. Do not eat food after midnight the night before your procedure. NO GUM OR CANDY AFTER MIDNIGHT. You may drink clear liquids up to 2 hours before you are scheduled to arrive at the hospital for your procedure.  Do not drink clear liquids within 2 hours of your scheduled arrival to the hospital.  Clear liquids include  --Water or Apple juice without pulp  --Gatorade  --Black Coffee or Clear Tea (No milk, no creamers, do not add anything to the coffee or Tea   ____Ensure clear carbohydrate drink on the way to the hospital for bariatric patients  ____Ensure clear carbohydrate drink 3 hours before surgery.     __x__ 2. No Alcohol for 24 hours before or after surgery.   __x__3. No Smoking or e-cigarettes for 24 prior to surgery.  Do not use any chewable tobacco products for at least 6 hour prior to surgery   ____  4. Bring all medications with you on the day of surgery if instructed.    __x__ 5. Notify your doctor if there is any change in your medical condition     (cold, fever, infections).    x___6. On the morning of surgery brush your teeth with toothpaste and water.  You may rinse your mouth with mouth wash if you wish.  Do not swallow any toothpaste or mouthwash.   Do not wear jewelry, make-up, hairpins, clips or nail polish.  Do not wear lotions, powders, or perfumes.   Do not shave 48 hours prior to surgery. Men may shave face and neck.  Do not bring valuables to  the hospital.    [facility] is not responsible for any belongings or valuables.               Contacts, dentures or bridgework may not be worn into surgery.  Leave your suitcase in the car. After surgery it may be brought to your room.  For patients admitted to the hospital, discharge time is determined by your treatment team.  _  Patients discharged the day of surgery will not be allowed to drive home.  You will need someone to drive you home and stay with you the night of your procedure.    Please read over the following fact sheets that you were given:   [facility] Preparing for Surgery  _x___ TAKE THE FOLLOWING MEDICATION THE MORNING OF SURGERY WITH A SMALL SIP OF WATER. These include:  1. PROTONIX (PANTOPRAZOLE)  2.  3.  4.  5.  6.  ____Fleets enema or Magnesium Citrate as directed.   _x___ Use CHG Soap or sage wipes as directed on instruction sheet   ____ Use inhalers on the day of surgery and bring to hospital day of surgery  ____ Stop Metformin and Janumet 2 days prior to surgery.    ____ Take 1/2 of usual insulin dose the night before surgery and none on the morning surgery.   ____ Follow recommendations from Cardiologist, Pulmonologist or PCP regarding  stopping Aspirin, Coumadin, Plavix ,Eliquis, Effient, or Pradaxa, and Pletal.  X____Stop Anti-inflammatories such as Advil, Aleve, Ibuprofen, Motrin, Naproxen, Naprosyn, Goodies powders or aspirin products NOW-OK to take Tylenol    ____ Stop supplements until after surgery.    ____ Bring C-Pap to the hospital.

## 2019-04-28 ENCOUNTER — Other Ambulatory Visit
Admission: RE | Admit: 2019-04-28 | Discharge: 2019-04-28 | Disposition: A | Discharge status: Home / Self Care | Payer: PRIVATE HEALTH INSURANCE | Source: Ambulatory Visit | Attending: Surgery | Admitting: Surgery

## 2019-04-28 ENCOUNTER — Other Ambulatory Visit: Payer: Self-pay

## 2019-04-28 DIAGNOSIS — Z01812 Encounter for preprocedural laboratory examination: Secondary | ICD-10-CM | POA: Diagnosis not present

## 2019-04-28 LAB — SARS CORONAVIRUS 2 (TAT 6-24 HRS): SARS Coronavirus 2: NEGATIVE

## 2019-04-29 ENCOUNTER — Telehealth: Payer: Self-pay | Admitting: Emergency Medicine

## 2019-04-29 NOTE — Telephone Encounter (Signed)
Patient made aware of Negative covid test.

## 2019-04-29 NOTE — Telephone Encounter (Signed)
-----   Message from Diego F Pabon, MD sent at 04/29/2019  7:25 AM EST ----- Please let pt know they r covid negative ----- Message ----- From: Interface, Lab In [facility] Sent: 04/28/2019   4:45 PM EST To: Diego F Pabon, MD   

## 2019-04-30 ENCOUNTER — Ambulatory Visit: Payer: PRIVATE HEALTH INSURANCE | Admitting: Anesthesiology

## 2019-04-30 ENCOUNTER — Encounter
Admission: RE | Disposition: A | Discharge status: Home / Self Care | Payer: Self-pay | Source: Home / Self Care | Attending: Surgery

## 2019-04-30 ENCOUNTER — Ambulatory Visit
Admission: RE | Admit: 2019-04-30 | Discharge: 2019-04-30 | Disposition: A | Discharge status: Home / Self Care | Payer: PRIVATE HEALTH INSURANCE | Attending: Surgery | Admitting: Surgery

## 2019-04-30 ENCOUNTER — Encounter: Payer: Self-pay | Admitting: Surgery

## 2019-04-30 DIAGNOSIS — Z87891 Personal history of nicotine dependence: Secondary | ICD-10-CM | POA: Insufficient documentation

## 2019-04-30 DIAGNOSIS — K4090 Unilateral inguinal hernia, without obstruction or gangrene, not specified as recurrent: Secondary | ICD-10-CM

## 2019-04-30 HISTORY — PX: XI ROBOTIC ASSISTED INGUINAL HERNIA REPAIR WITH MESH: SHX6706

## 2019-04-30 SURGERY — REPAIR, HERNIA, INGUINAL, ROBOT-ASSISTED, LAPAROSCOPIC, USING MESH
Anesthesia: General | Site: Inguinal | Laterality: Right

## 2019-04-30 MED ORDER — MIDAZOLAM HCL 2 MG/2ML IJ SOLN
INTRAMUSCULAR | Status: AC
  Filled 2019-04-30: qty 2

## 2019-04-30 MED ORDER — MIDAZOLAM HCL 2 MG/2ML IJ SOLN
INTRAMUSCULAR | Status: DC | PRN
  Administered 2019-04-30: 2 mg via INTRAVENOUS

## 2019-04-30 MED ORDER — FENTANYL CITRATE (PF) 100 MCG/2ML IJ SOLN
INTRAMUSCULAR | Status: DC | PRN
  Administered 2019-04-30 (×2): 50 ug via INTRAVENOUS

## 2019-04-30 MED ORDER — DEXAMETHASONE SODIUM PHOSPHATE 10 MG/ML IJ SOLN
INTRAMUSCULAR | Status: DC | PRN
  Administered 2019-04-30: 5 mg via INTRAVENOUS

## 2019-04-30 MED ORDER — HYDROCODONE-ACETAMINOPHEN 5-325 MG PO TABS
ORAL_TABLET | ORAL | Status: AC
  Filled 2019-04-30: qty 1

## 2019-04-30 MED ORDER — HYDROCODONE-ACETAMINOPHEN 5-325 MG PO TABS
1.0000 | ORAL_TABLET | Freq: Four times a day (QID) | ORAL | Status: DC | PRN
  Administered 2019-04-30: 1 via ORAL

## 2019-04-30 MED ORDER — FENTANYL CITRATE (PF) 100 MCG/2ML IJ SOLN
25.0000 ug | INTRAMUSCULAR | Status: DC | PRN
  Administered 2019-04-30 (×5): 25 ug via INTRAVENOUS

## 2019-04-30 MED ORDER — SUCCINYLCHOLINE CHLORIDE 20 MG/ML IJ SOLN
INTRAMUSCULAR | Status: AC
  Filled 2019-04-30: qty 1

## 2019-04-30 MED ORDER — GLYCOPYRROLATE 0.2 MG/ML IJ SOLN
INTRAMUSCULAR | Status: AC
  Filled 2019-04-30: qty 1

## 2019-04-30 MED ORDER — STERILE WATER FOR INJECTION IJ SOLN
INTRAMUSCULAR | Status: AC
  Filled 2019-04-30: qty 10

## 2019-04-30 MED ORDER — CELECOXIB 200 MG PO CAPS: 200.0000 mg | ORAL_CAPSULE | ORAL | Status: AC

## 2019-04-30 MED ORDER — GABAPENTIN 300 MG PO CAPS
ORAL_CAPSULE | ORAL | Status: AC
  Administered 2019-04-30: 300 mg via ORAL
  Filled 2019-04-30: qty 1

## 2019-04-30 MED ORDER — PHENYLEPHRINE HCL (PRESSORS) 10 MG/ML IV SOLN
INTRAVENOUS | Status: AC
  Filled 2019-04-30: qty 1

## 2019-04-30 MED ORDER — CEFAZOLIN SODIUM-DEXTROSE 2-4 GM/100ML-% IV SOLN
INTRAVENOUS | Status: AC
  Filled 2019-04-30: qty 100

## 2019-04-30 MED ORDER — BUPIVACAINE-EPINEPHRINE 0.25% -1:200000 IJ SOLN
INTRAMUSCULAR | Status: DC | PRN
  Administered 2019-04-30: 30 mL

## 2019-04-30 MED ORDER — SUGAMMADEX SODIUM 200 MG/2ML IV SOLN
INTRAVENOUS | Status: AC
  Filled 2019-04-30: qty 2

## 2019-04-30 MED ORDER — DEXAMETHASONE SODIUM PHOSPHATE 10 MG/ML IJ SOLN
INTRAMUSCULAR | Status: AC
  Filled 2019-04-30: qty 1

## 2019-04-30 MED ORDER — ONDANSETRON HCL 4 MG/2ML IJ SOLN
INTRAMUSCULAR | Status: DC | PRN
  Administered 2019-04-30: 4 mg via INTRAVENOUS

## 2019-04-30 MED ORDER — ROCURONIUM BROMIDE 100 MG/10ML IV SOLN
INTRAVENOUS | Status: DC | PRN
  Administered 2019-04-30: 5 mg via INTRAVENOUS

## 2019-04-30 MED ORDER — ROCURONIUM BROMIDE 50 MG/5ML IV SOLN
INTRAVENOUS | Status: AC
  Filled 2019-04-30: qty 1

## 2019-04-30 MED ORDER — CHLORHEXIDINE GLUCONATE CLOTH 2 % EX PADS
6.0000 | MEDICATED_PAD | Freq: Once | CUTANEOUS | Status: AC
  Administered 2019-04-30: 6 via TOPICAL

## 2019-04-30 MED ORDER — ACETAMINOPHEN 500 MG PO TABS
ORAL_TABLET | ORAL | Status: AC
  Administered 2019-04-30: 1000 mg via ORAL
  Filled 2019-04-30: qty 2

## 2019-04-30 MED ORDER — GABAPENTIN 300 MG PO CAPS: 300.0000 mg | ORAL_CAPSULE | ORAL | Status: AC

## 2019-04-30 MED ORDER — PROPOFOL 10 MG/ML IV BOLUS
INTRAVENOUS | Status: DC | PRN
  Administered 2019-04-30: 170 mg via INTRAVENOUS

## 2019-04-30 MED ORDER — ONDANSETRON HCL 4 MG/2ML IJ SOLN
INTRAMUSCULAR | Status: AC
  Filled 2019-04-30: qty 2

## 2019-04-30 MED ORDER — EPHEDRINE SULFATE 50 MG/ML IJ SOLN
INTRAMUSCULAR | Status: AC
  Filled 2019-04-30: qty 1

## 2019-04-30 MED ORDER — FENTANYL CITRATE (PF) 100 MCG/2ML IJ SOLN
INTRAMUSCULAR | Status: AC
  Administered 2019-04-30: 11:00:00 25 ug via INTRAVENOUS
  Filled 2019-04-30: qty 2

## 2019-04-30 MED ORDER — HYDROCODONE-ACETAMINOPHEN 5-325 MG PO TABS: 1.0000 | ORAL_TABLET | Freq: Four times a day (QID) | ORAL | 0 refills | Status: AC | PRN

## 2019-04-30 MED ORDER — ONDANSETRON HCL 4 MG/2ML IJ SOLN: 4.0000 mg | Freq: Once | INTRAMUSCULAR | Status: DC | PRN

## 2019-04-30 MED ORDER — CHLORHEXIDINE GLUCONATE CLOTH 2 % EX PADS: 6.0000 | MEDICATED_PAD | Freq: Once | CUTANEOUS | Status: DC

## 2019-04-30 MED ORDER — SEVOFLURANE IN SOLN
RESPIRATORY_TRACT | Status: AC
  Filled 2019-04-30: qty 250

## 2019-04-30 MED ORDER — BUPIVACAINE HCL (PF) 0.25 % IJ SOLN
INTRAMUSCULAR | Status: AC
  Filled 2019-04-30: qty 30

## 2019-04-30 MED ORDER — ACETAMINOPHEN 500 MG PO TABS: 1000.0000 mg | ORAL_TABLET | ORAL | Status: AC

## 2019-04-30 MED ORDER — SODIUM CHLORIDE FLUSH 0.9 % IV SOLN
INTRAVENOUS | Status: AC
  Filled 2019-04-30: qty 10

## 2019-04-30 MED ORDER — LIDOCAINE HCL (PF) 2 % IJ SOLN
INTRAMUSCULAR | Status: AC
  Filled 2019-04-30: qty 10

## 2019-04-30 MED ORDER — LACTATED RINGERS IV SOLN: INTRAVENOUS | Status: DC

## 2019-04-30 MED ORDER — CELECOXIB 200 MG PO CAPS
ORAL_CAPSULE | ORAL | Status: AC
  Administered 2019-04-30: 200 mg via ORAL
  Filled 2019-04-30: qty 1

## 2019-04-30 MED ORDER — FENTANYL CITRATE (PF) 100 MCG/2ML IJ SOLN
INTRAMUSCULAR | Status: AC
  Filled 2019-04-30: qty 2

## 2019-04-30 MED ORDER — SUCCINYLCHOLINE CHLORIDE 20 MG/ML IJ SOLN
INTRAMUSCULAR | Status: DC | PRN
  Administered 2019-04-30: 100 mg via INTRAVENOUS

## 2019-04-30 MED ORDER — LIDOCAINE HCL (CARDIAC) PF 100 MG/5ML IV SOSY
PREFILLED_SYRINGE | INTRAVENOUS | Status: DC | PRN
  Administered 2019-04-30: 80 mg via INTRAVENOUS

## 2019-04-30 MED ORDER — CEFAZOLIN SODIUM-DEXTROSE 2-4 GM/100ML-% IV SOLN
2.0000 g | INTRAVENOUS | Status: AC
  Administered 2019-04-30: 2 g via INTRAVENOUS

## 2019-04-30 SURGICAL SUPPLY — 44 items
CANISTER SUCT 1200ML W/VALVE (MISCELLANEOUS) ×3 IMPLANT
CHLORAPREP W/TINT 26 (MISCELLANEOUS) ×3 IMPLANT
COVER TIP SHEARS 8 DVNC (MISCELLANEOUS) ×1 IMPLANT
COVER TIP SHEARS 8MM DA VINCI (MISCELLANEOUS) ×2
COVER WAND RF STERILE (DRAPES) ×3 IMPLANT
DEFOGGER SCOPE WARMER CLEARIFY (MISCELLANEOUS) ×3 IMPLANT
DERMABOND ADVANCED (GAUZE/BANDAGES/DRESSINGS) ×2
DERMABOND ADVANCED .7 DNX12 (GAUZE/BANDAGES/DRESSINGS) ×1 IMPLANT
DRAPE 3/4 80X56 (DRAPES) ×3 IMPLANT
DRAPE ARM DVNC X/XI (DISPOSABLE) ×3 IMPLANT
DRAPE COLUMN DVNC XI (DISPOSABLE) ×1 IMPLANT
DRAPE DA VINCI XI ARM (DISPOSABLE) ×6
DRAPE DA VINCI XI COLUMN (DISPOSABLE) ×2
ELECT CAUTERY BLADE 6.4 (BLADE) ×3 IMPLANT
ELECT REM PT RETURN 9FT ADLT (ELECTROSURGICAL) ×3
ELECTRODE REM PT RTRN 9FT ADLT (ELECTROSURGICAL) ×1 IMPLANT
GLOVE BIO SURGEON STRL SZ7 (GLOVE) ×12 IMPLANT
GOWN STRL REUS W/ TWL LRG LVL3 (GOWN DISPOSABLE) ×4 IMPLANT
GOWN STRL REUS W/TWL LRG LVL3 (GOWN DISPOSABLE) ×8
IRRIGATION STRYKERFLOW (MISCELLANEOUS) IMPLANT
IRRIGATOR STRYKERFLOW (MISCELLANEOUS)
IV NS 1000ML (IV SOLUTION)
IV NS 1000ML BAXH (IV SOLUTION) IMPLANT
KIT PINK PAD W/HEAD ARE REST (MISCELLANEOUS) ×3
KIT PINK PAD W/HEAD ARM REST (MISCELLANEOUS) ×1 IMPLANT
LABEL OR SOLS (LABEL) ×3 IMPLANT
MESH 3DMAX LIGHT 4.1X6.2 RT LR (Mesh General) ×3 IMPLANT
NEEDLE HYPO 22GX1.5 SAFETY (NEEDLE) ×3 IMPLANT
OBTURATOR OPTICAL STANDARD 8MM (TROCAR) ×2
OBTURATOR OPTICAL STND 8 DVNC (TROCAR) ×1
OBTURATOR OPTICALSTD 8 DVNC (TROCAR) ×1 IMPLANT
PACK LAP CHOLECYSTECTOMY (MISCELLANEOUS) ×3 IMPLANT
PENCIL ELECTRO HAND CTR (MISCELLANEOUS) ×3 IMPLANT
SEAL CANN UNIV 5-8 DVNC XI (MISCELLANEOUS) ×3 IMPLANT
SEAL XI 5MM-8MM UNIVERSAL (MISCELLANEOUS) ×6
SOLUTION ELECTROLUBE (MISCELLANEOUS) ×3 IMPLANT
SPONGE LAP 18X18 RF (DISPOSABLE) ×3 IMPLANT
SUT MNCRL AB 4-0 PS2 18 (SUTURE) ×6 IMPLANT
SUT VIC AB 2-0 SH 27 (SUTURE) ×4
SUT VIC AB 2-0 SH 27XBRD (SUTURE) ×2 IMPLANT
SUT VICRYL 0 AB UR-6 (SUTURE) ×6 IMPLANT
SUT VLOC 90 S/L VL9 GS22 (SUTURE) ×3 IMPLANT
TROCAR BALLN GELPORT 12X130M (ENDOMECHANICALS) ×3 IMPLANT
TUBING EVAC SMOKE HEATED PNEUM (TUBING) ×3 IMPLANT

## 2019-04-30 NOTE — Anesthesia Procedure Notes (Signed)
Procedure Name: Intubation Date/Time: 04/30/2019 7:39 AM Performed by: Zetta Bills, CRNA Pre-anesthesia Checklist: Patient identified, Emergency Drugs available, Suction available and Patient being monitored Patient Re-evaluated:Patient Re-evaluated prior to induction Oxygen Delivery Method: Circle system utilized Preoxygenation: Pre-oxygenation with 100% oxygen Induction Type: IV induction Ventilation: Mask ventilation without difficulty Laryngoscope Size: Mac and 3 Grade View: Grade I Tube type: Oral Tube size: 7.0 mm Number of attempts: 1 Airway Equipment and Method: Stylet Placement Confirmation: ETT inserted through vocal cords under direct vision Secured at: 21 cm Tube secured with: Tape Dental Injury: Teeth and Oropharynx as per pre-operative assessment

## 2019-04-30 NOTE — Interval H&P Note (Signed)
History and Physical Interval Note:  04/30/2019 7:21 AM  Walter Coffey  has presented today for surgery, with the diagnosis of right inguinal hernia.  The various methods of treatment have been discussed with the patient and family. After consideration of risks, benefits and other options for treatment, the patient has consented to  Procedure(s): XI ROBOTIC ASSISTED INGUINAL HERNIA REPAIR WITH MESH (Right) as a surgical intervention.  The patient's history has been reviewed, patient examined, no change in status, stable for surgery.  I have reviewed the patient's chart and labs.  Questions were answered to the patient's satisfaction.     Earl Zellmer F Jalin Erpelding

## 2019-04-30 NOTE — Transfer of Care (Signed)
Immediate Anesthesia Transfer of Care Note  Patient: Walter Coffey  Procedure(s) Performed: XI ROBOTIC ASSISTED INGUINAL HERNIA REPAIR WITH MESH (Right Inguinal)  Patient Location: PACU  Anesthesia Type:General  Level of Consciousness: awake  Airway & Oxygen Therapy: Patient Spontanous Breathing  Post-op Assessment: Report given to RN  Post vital signs: stable  Last Vitals:  Vitals Value Taken Time  BP 148/97 04/30/19 0925  Temp 36.4 C 04/30/19 0924  Pulse 87 04/30/19 0928  Resp 10 04/30/19 0928  SpO2 91 % 04/30/19 0928  Vitals shown include unvalidated device data.  Last Pain:  Vitals:   04/30/19 0924  TempSrc:   PainSc: 0-No pain         Complications: No apparent anesthesia complications

## 2019-04-30 NOTE — Anesthesia Preprocedure Evaluation (Signed)
Anesthesia Evaluation  Patient identified by MRN, date of birth, ID band Patient awake    Reviewed: Allergy & Precautions, NPO status , Unable to perform ROS - Chart review only  History of Anesthesia Complications (+) Family history of anesthesia reactionNegative for: history of anesthetic complications  Airway Mallampati: II       Dental   Pulmonary neg sleep apnea, neg COPD, Not current smoker, former smoker,           Cardiovascular (-) hypertension(-) Past MI and (-) CHF (-) dysrhythmias (-) Valvular Problems/Murmurs     Neuro/Psych neg Seizures    GI/Hepatic Neg liver ROS, GERD  Medicated and Controlled,  Endo/Other  neg diabetes  Renal/GU negative Renal ROS     Musculoskeletal   Abdominal   Peds  Hematology   Anesthesia Other Findings   Reproductive/Obstetrics                             Anesthesia Physical Anesthesia Plan  ASA: II  Anesthesia Plan: General   Post-op Pain Management:    Induction: Intravenous  PONV Risk Score and Plan: 2  Airway Management Planned: Oral ETT  Additional Equipment:   Intra-op Plan:   Post-operative Plan:   Informed Consent: I have reviewed the patients History and Physical, chart, labs and discussed the procedure including the risks, benefits and alternatives for the proposed anesthesia with the patient or authorized representative who has indicated his/her understanding and acceptance.       Plan Discussed with:   Anesthesia Plan Comments:         Anesthesia Quick Evaluation

## 2019-04-30 NOTE — Op Note (Signed)
Robotic assisted Laparoscopic Transabdominal right  Inguinal Hernia Repair with 3 D large Mesh      Pre-operative Diagnosis:  Right Inguinal Hernia   Post-operative Diagnosis: Same   Procedure: Robotic Laparoscopic  repair of Right  inguinal hernias   Surgeon: Sterling Big, MD FACS   Anesthesia: Gen. with endotracheal tube   Findings: Right Direct inguinal hernia        Procedure Details  The patient was seen again in the Holding Room. The benefits, complications, treatment options, and expected outcomes were discussed with the patient. The risks of bleeding, infection, recurrence of symptoms, failure to resolve symptoms, recurrence of hernia, ischemic orchitis, chronic pain syndrome or neuroma, were discussed again. The likelihood of improving the patient's symptoms with return to their baseline status is good.  The patient and/or family concurred with the proposed plan, giving informed consent.  The patient was taken to Operating Room, identified  and the procedure verified as Laparoscopic Inguinal Hernia Repair. Laterality confirmed.  A Time Out was held and the above information confirmed.   Prior to the induction of general anesthesia, antibiotic prophylaxis was administered. VTE prophylaxis was in place. General endotracheal anesthesia was then administered and tolerated well. After the induction, the abdomen was prepped with Chloraprep and draped in the sterile fashion. The patient was positioned in the supine position.     Supraumbilical incision created and cut down technique used to enter the abdominal cavity. Fascia elevated and incised and hasson trochar placed. Pneumoperitoneum obtained w/o HD changes. No evidence of bowel injuries.  Two 8 mm placed under direct vision. The laparoscopy revealed large indirect defects. I inserted the needles and the mesh. The robot was brought ot the table and docked in the standard fashion, no collision between arms was observed. Instruments were  kept under direct view at all times. We started on the right side were a flap was created. The sac was reduced and dissected free from adjacent structures. We preserved the vas and the vessels. Once dissection was completed a large 3D mesh was placed and secured with two interrupted vicryl attached to the pubic tubercle. There was good coverage of the direct, indirect and femoral spaces. The flap was closed with v lock suture.   Second look revealed no complications or injuries.    Once assuring that hemostasis was adequate the ports were removed and a figure-of-eight 0 Vicryl suture was placed at the fascial edges. 4-0 subcuticular Monocryl was used at all skin edges. Dermabond was placed.  Patient tolerated the procedure well. There were no complications. He was taken to the recovery room in stable condition.                Sterling Big, MD, FACS

## 2019-04-30 NOTE — Anesthesia Postprocedure Evaluation (Signed)
Anesthesia Post Note  Patient: Walter Coffey  Procedure(s) Performed: XI ROBOTIC ASSISTED INGUINAL HERNIA REPAIR WITH MESH (Right Inguinal)  Patient location during evaluation: PACU Anesthesia Type: General Level of consciousness: awake and alert Pain management: pain level controlled Vital Signs Assessment: post-procedure vital signs reviewed and stable Respiratory status: spontaneous breathing and respiratory function stable Cardiovascular status: blood pressure returned to baseline and stable Anesthetic complications: no     Last Vitals:  Vitals:   04/30/19 1030 04/30/19 1040  BP:  132/84  Pulse: 84 74  Resp: 13 11  Temp:    SpO2: 95% 99%    Last Pain:  Vitals:   04/30/19 1040  TempSrc:   PainSc: 6                  [name] K

## 2019-04-30 NOTE — Discharge Instructions (Addendum)
AMBULATORY SURGERY  DISCHARGE INSTRUCTIONS   1) The drugs that you were given will stay in your system until tomorrow so for the next 24 hours you should not:  A) Drive an automobile B) Make any legal decisions C) Drink any alcoholic beverage   2) You may resume regular meals tomorrow.  Today it is better to start with liquids and gradually work up to solid foods.  You may eat anything you prefer, but it is better to start with liquids, then soup and crackers, and gradually work up to solid foods.   3) Please notify your doctor immediately if you have any unusual bleeding, trouble breathing, redness and pain at the surgery site, drainage, fever, or pain not relieved by medication.    4) Additional Instructions:        Please contact your physician with any problems or Same Day Surgery at [phone], Monday through Friday 6 am to 4 pm, or [place] [place] [facility] [place] Main number at [phone].   Laparoscopic Inguinal Hernia Repair, Adult, Care After This sheet gives you information about how to care for yourself after your procedure. Your health care provider may also give you more specific instructions. If you have problems or questions, contact your health care provider. What can I expect after the procedure? After the procedure, it is common to have:  Pain.  Swelling and bruising around the incision area.  Scrotal swelling, in men.  Some fluid or blood draining from your incisions. Follow these instructions at home: Incision care  Follow instructions from your health care provider about how to take care of your incisions. Make sure you: ? Wash your hands with soap and water before you change your bandage (dressing). If soap and water are not available, use hand sanitizer. ? Change your dressing as told by your health care provider. ? Leave stitches (sutures), skin glue, or adhesive strips in place. These skin closures may need to stay in place for 2 weeks or longer.  If adhesive strip edges start to loosen and curl up, you may trim the loose edges. Do not remove adhesive strips completely unless your health care provider tells you to do that.  Check your incision area every day for signs of infection. Check for: ? More redness, swelling, or pain. ? More fluid or blood. ? Warmth. ? Pus or a bad smell.  Wear loose, soft clothing while your incisions heal. Driving  Do not drive or use heavy machinery while taking prescription pain medicine.  Do not drive for 24 hours if you were given a medicine to help you relax (sedative) during your procedure. Activity  Do not lift anything that is heavier than 10 lb (4.5 kg), or the limit that you are told, until your health care provider says that it is safe.  Ask your health care provider what activities are safe for you. A lot of activity during the first week after surgery can increase pain and swelling. For 1 week after your procedure: ? Avoid activities that take a lot of effort, such as exercise or sports. ? You may walk and climb stairs as needed for daily activity, but avoid long walks or climbing stairs for exercise. Managing pain and swelling   Put ice on painful or swollen areas: ? Put ice in a plastic bag. ? Place a towel between your skin and the bag. ? Leave the ice on for 20 minutes, 2-3 times a day. General instructions  Do not take baths, swim, or use   a hot tub until your health care provider approves. Ask your health care provider if you may take showers. You may only be allowed to take sponge baths.  Take over-the-counter and prescription medicines only as told by your health care provider.  To prevent or treat constipation while you are taking prescription pain medicine, your health care provider may recommend that you: ? Drink enough fluid to keep your urine pale yellow. ? Take over-the-counter or prescription medicines. ? Eat foods that are high in fiber, such as fresh fruits and  vegetables, whole grains, and beans. ? Limit foods that are high in fat and processed sugars, such as fried and sweet foods.  Do not use any products that contain nicotine or tobacco, such as cigarettes and e-cigarettes. If you need help quitting, ask your health care provider.  Drink enough fluid to keep your urine pale yellow.  Keep all follow-up visits as told by your health care provider. This is important. Contact a health care provider if:  You have more redness, swelling, or pain around your incisions or your groin area.  You have more swelling in your scrotum.  You have more fluid or blood coming from your incisions.  Your incisions feel warm to the touch.  You have severe pain and medicines do not help.  You have abdominal pain or swelling.  You cannot eat or drink without vomiting.  You cannot urinate or pass a bowel movement.  You faint.  You feel dizzy.  You have nausea and vomiting.  You have a fever. Get help right away if:  You have pus or a bad smell coming from your incisions.  You have redness, warmth, or pain in your leg.  You have chest pain.  You have problems breathing. Summary  Pain, swelling, and bruising are common after the procedure.  Check your incision area every day for signs of infection, such as more redness, swelling, or pain.  Put ice on painful or swollen areas for 20 minutes, 2-3 times a day. This information is not intended to replace advice given to you by your health care provider. Make sure you discuss any questions you have with your health care provider. Document Revised: 09/02/2018 Document Reviewed: 07/04/2016 Elsevier Patient Education  2020 [employer]  

## 2019-05-12 ENCOUNTER — Other Ambulatory Visit: Payer: Self-pay

## 2019-05-12 ENCOUNTER — Telehealth (INDEPENDENT_AMBULATORY_CARE_PROVIDER_SITE_OTHER): Payer: PRIVATE HEALTH INSURANCE | Admitting: Surgery

## 2019-05-12 DIAGNOSIS — Z09 Encounter for follow-up examination after completed treatment for conditions other than malignant neoplasm: Secondary | ICD-10-CM

## 2019-05-12 NOTE — Progress Notes (Signed)
Conversation with patient on his cellphone S/p Robotic RIH repair 1/22 Doing very well No pain, no edema Ambulating and no evidence of recurrence  A/P Doing very well Continue lifting restriction for total of 6 wks < 20 lbs RTC prn

## 2021-01-03 ENCOUNTER — Encounter: Payer: Self-pay | Admitting: General Surgery

## 2021-06-13 IMAGING — CT CT ABDOMEN AND PELVIS WITH CONTRAST
2 of 4 series · 16 of 46 positions shown, 18 images · IV contrast (omnipaque)
Comparison: None.

CLINICAL DATA: Right-sided groin hernia, pain

EXAM:
CT ABDOMEN AND PELVIS WITH CONTRAST
TECHNIQUE: Multidetector CT imaging of the abdomen and pelvis was performed
using the standard protocol following bolus administration of
intravenous contrast.
CONTRAST:  100mL OMNIPAQUE IOHEXOL 300 MG/ML  SOLN

[Series 2: routine abd/pel with · axial · 0.85mm/px · z∈[-619,-119]mm · 13 of 110 slices shown, 15 images]
[im 5/110  soft-tissue]
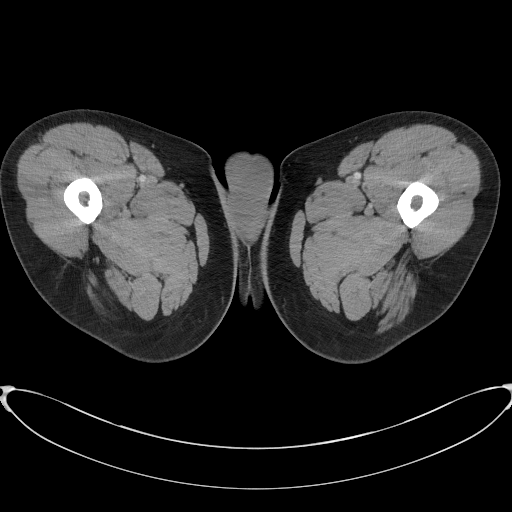
[im 5/110  bone]
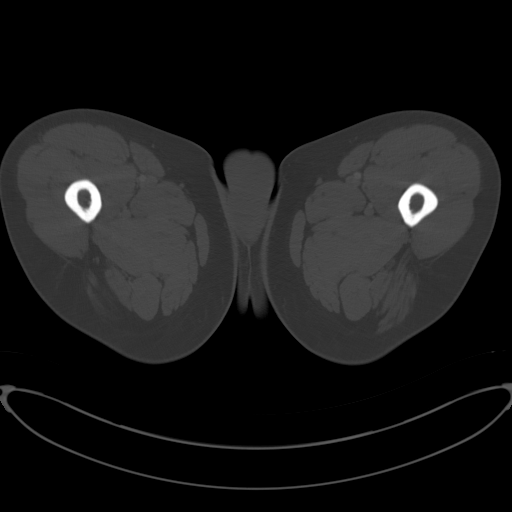
[im 14/110  soft-tissue]
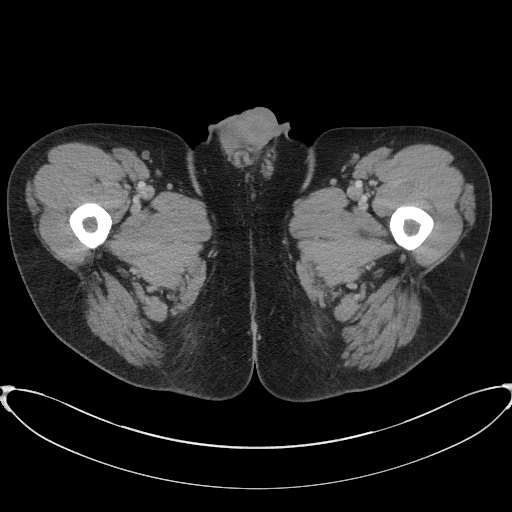
[im 22/110  soft-tissue]
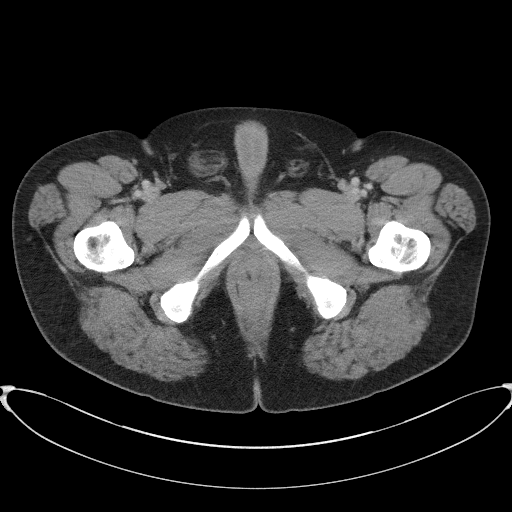
[im 31/110  soft-tissue]
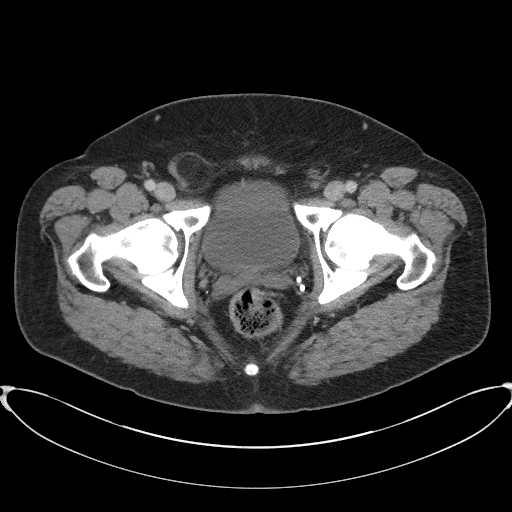
[im 40/110  soft-tissue]
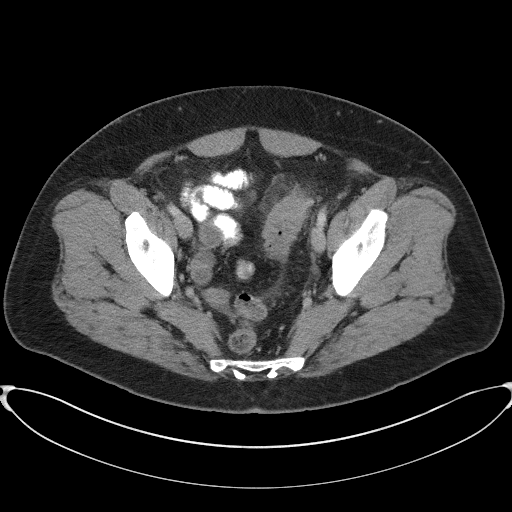
[im 48/110  soft-tissue]
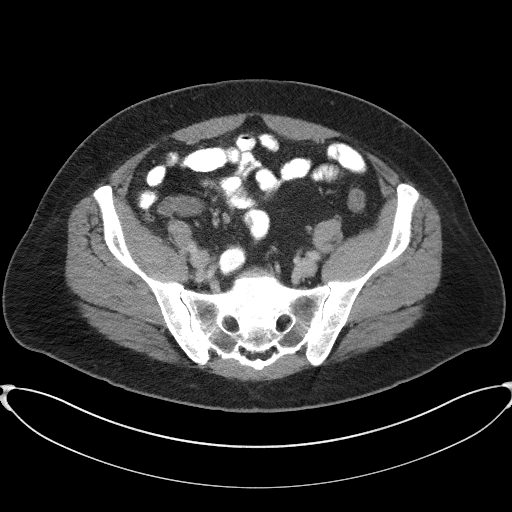
[im 57/110  soft-tissue]
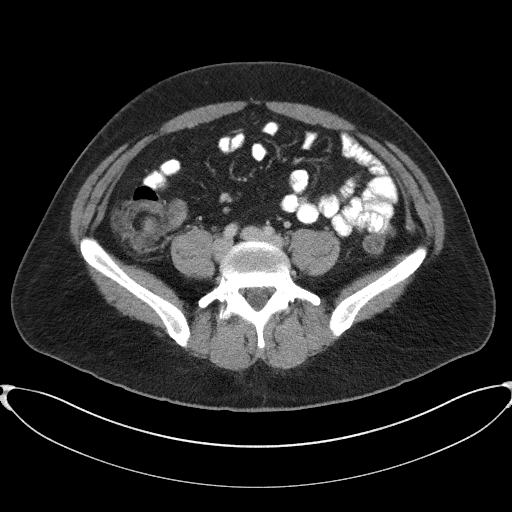
[im 62/110  soft-tissue]
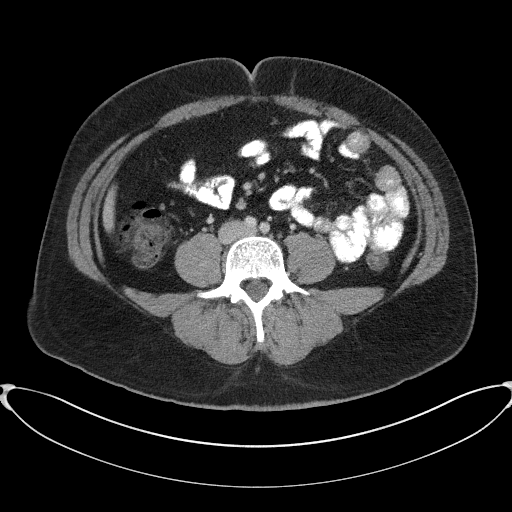
[im 70/110  soft-tissue]
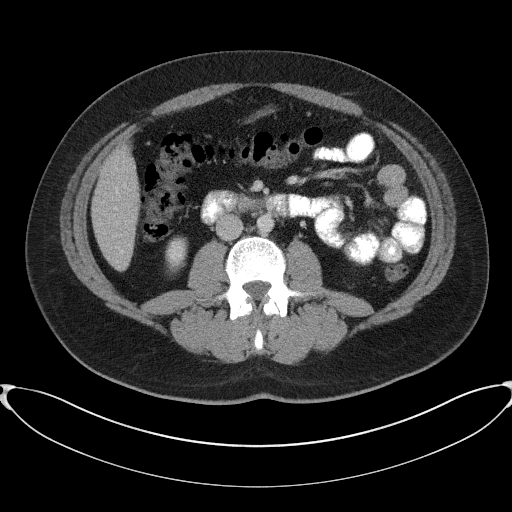
[im 70/110  bone]
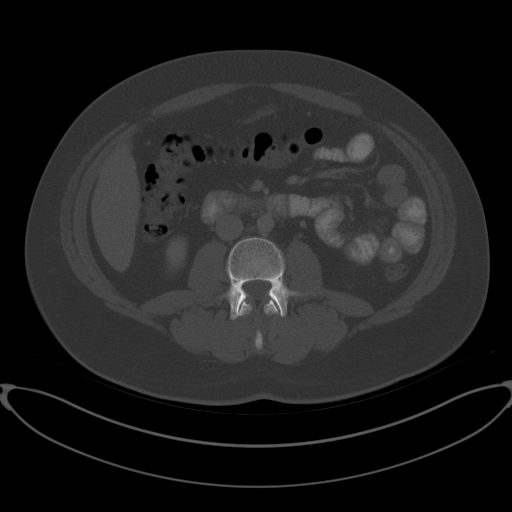
[im 79/110  soft-tissue]
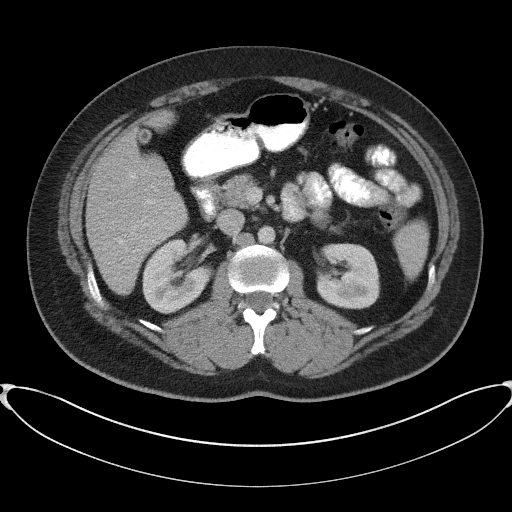
[im 88/110  soft-tissue]
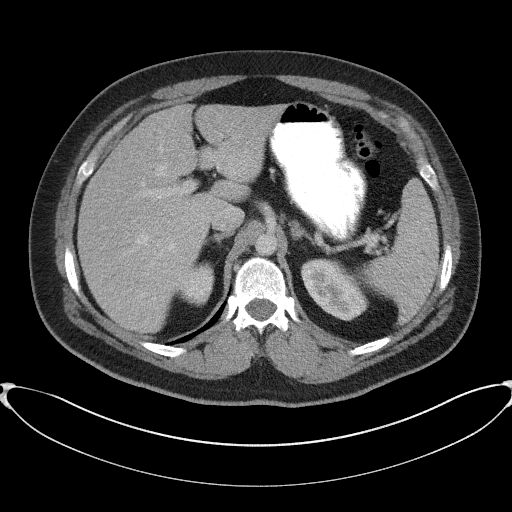
[im 96/110  soft-tissue]
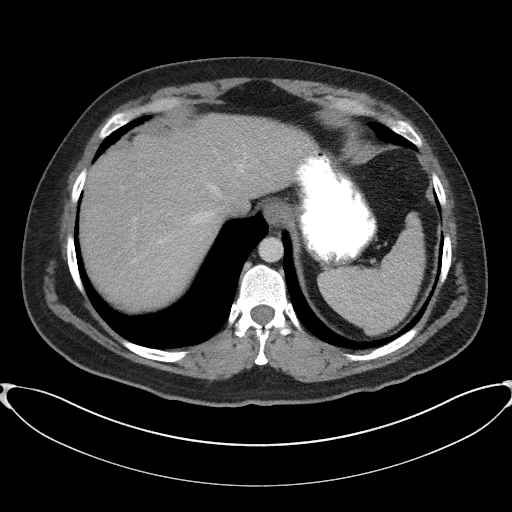
[im 105/110  soft-tissue]
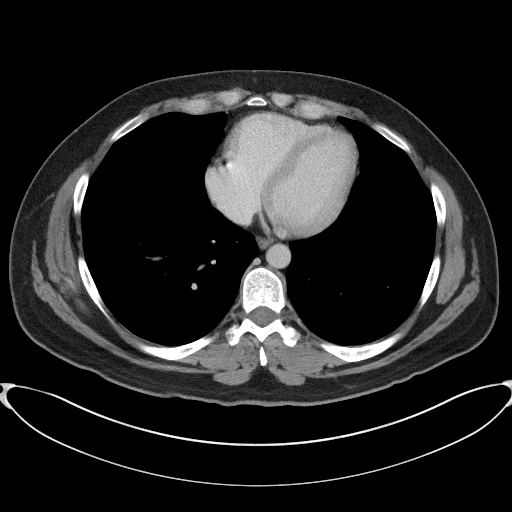

[Series 5: coronal st · coronal · 0.80mm/px · 3 of 101 slices shown]
[im 34/101  soft-tissue]
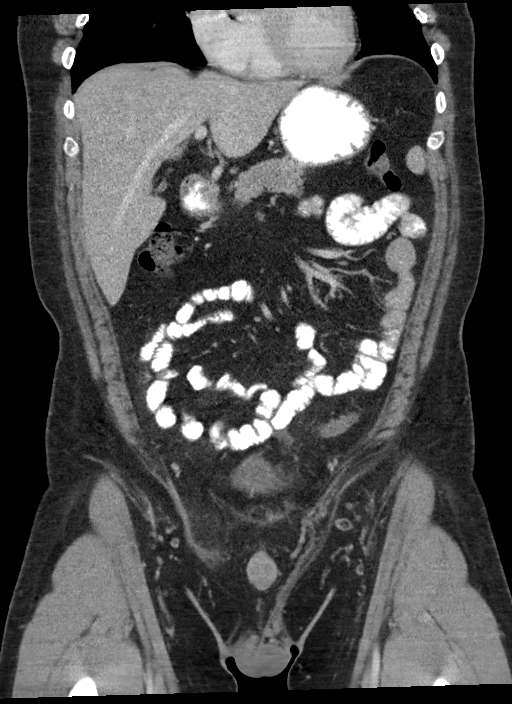
[im 45/101  soft-tissue]
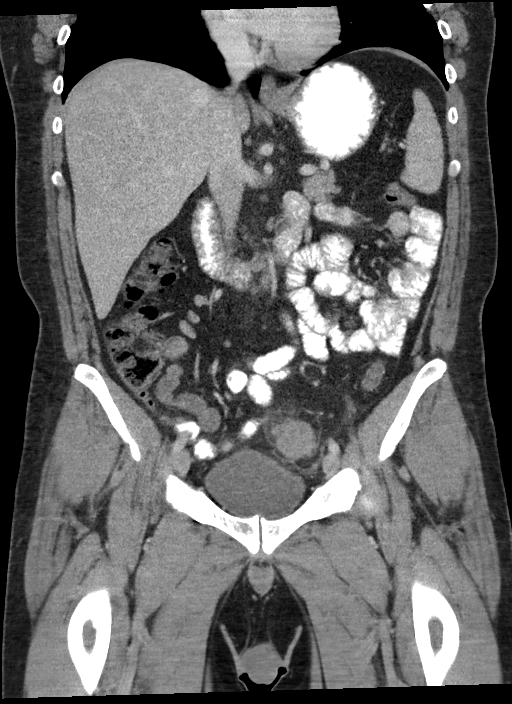
[im 56/101  soft-tissue]
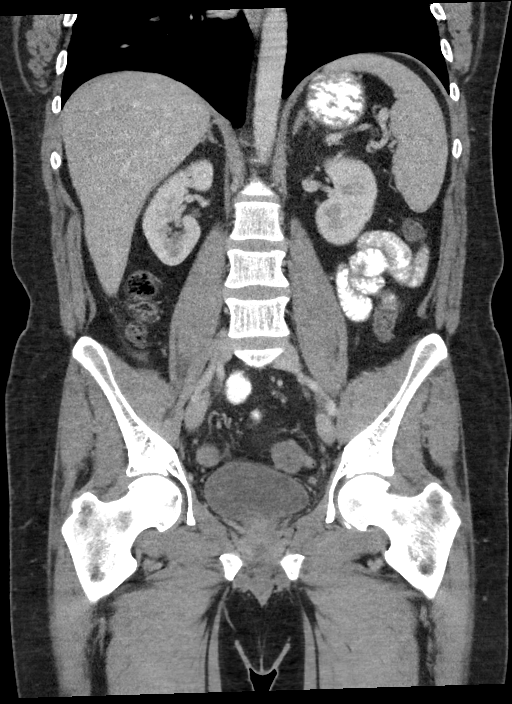

[16 of 46 positions shown; findings below may reference images not displayed]

FINDINGS: Lower chest: No acute abnormality.

Hepatobiliary: No solid liver abnormality is seen. No gallstones,
gallbladder wall thickening, or biliary dilatation.

Pancreas: Unremarkable. No pancreatic ductal dilatation or
surrounding inflammatory changes.

Spleen: Normal in size without significant abnormality.

Adrenals/Urinary Tract: Adrenal glands are unremarkable. Kidneys are
normal, without renal calculi, solid lesion, or hydronephrosis.
Bladder is unremarkable.

Stomach/Bowel: Stomach is within normal limits. Appendix appears
normal. There is sigmoid diverticulosis with inflammatory thickening
and fat stranding about the mid sigmoid (series 2, image 70, series
5, image 46).

Vascular/Lymphatic: No significant vascular findings are present. No
enlarged abdominal or pelvic lymph nodes.

Reproductive: No mass or other significant abnormality.

Other: There is a moderate, fat containing right inguinal hernia.
Trace fluid in the low pelvis.

Musculoskeletal: No acute or significant osseous findings.
IMPRESSION: 1. There is sigmoid diverticulosis with inflammatory thickening and
fat stranding about the mid sigmoid (series 2, image 70, series 5,
image 46). Findings are consistent with acute diverticulitis. No
evidence of perforation or abscess.

2.  Trace fluid in the low pelvis, likely reactive.

3.  There is a moderate, fat containing right inguinal hernia.

## 2023-04-03 ENCOUNTER — Ambulatory Visit
Admission: EM | Admit: 2023-04-03 | Discharge: 2023-04-03 | Disposition: A | Discharge status: Home / Self Care | Payer: BC Managed Care – PPO | Attending: Emergency Medicine | Admitting: Emergency Medicine

## 2023-04-03 ENCOUNTER — Encounter: Payer: Self-pay | Admitting: Emergency Medicine

## 2023-04-03 ENCOUNTER — Other Ambulatory Visit: Payer: Self-pay

## 2023-04-03 DIAGNOSIS — J069 Acute upper respiratory infection, unspecified: Secondary | ICD-10-CM

## 2023-04-03 LAB — POCT RAPID STREP A (OFFICE): Rapid Strep A Screen: NEGATIVE

## 2023-04-03 NOTE — Discharge Instructions (Addendum)
Your symptoms today are most likely being caused by a virus and should steadily improve in time it can take up to 7 to 10 days before you truly start to see a turnaround however things will get better  Strep negative for bacteria     You can take Tylenol and/or Ibuprofen as needed for fever reduction and pain relief.   For cough: honey 1/2 to 1 teaspoon (you can dilute the honey in water or another fluid).  You can also use guaifenesin and dextromethorphan for cough. You can use a humidifier for chest congestion and cough.  If you don't have a humidifier, you can sit in the bathroom with the hot shower running.      For sore throat: try warm salt water gargles, cepacol lozenges, throat spray, warm tea or water with lemon/honey, popsicles or ice, or OTC cold relief medicine for throat discomfort.   For congestion: take a daily anti-histamine like Zyrtec, Claritin, and a oral decongestant, such as pseudoephedrine.  You can also use Flonase 1-2 sprays in each nostril daily.   It is important to stay hydrated: drink plenty of fluids (water, gatorade/powerade/pedialyte, juices, or teas) to keep your throat moisturized and help further relieve irritation/discomfort.

## 2023-04-03 NOTE — ED Triage Notes (Addendum)
Woke with chest congestion and sore throat.  Intermittently has a runny nose.  Denies fever  One child at home had pneumonia and one has had strep.    Denies pain

## 2023-04-03 NOTE — ED Provider Notes (Signed)
Renaldo Fiddler    CSN: [id number] Arrival date & time: 04/03/23  1039      History   Chief Complaint No chief complaint on file.   HPI Walter Coffey is a 42 y.o. male.   Presents for evaluation of nasal congestion, rhinorrhea and a sore throat beginning 1 day ago.  Sore throat worse in the morning, progressively improves throughout the day.  Known sick contact with exposure to strep and pneumonia within household.   currently at appointment with another ill child.  Has attempted use of Mucinex day.  Denies fever.  Past Medical History:  Diagnosis Date   Diverticulitis    Family history of adverse reaction to anesthesia    PATERNAL GRANDFATHER HAD TROUBLE WAKING UP AFTER HIP SURGERY   GERD (gastroesophageal reflux disease)     Patient Active Problem List   Diagnosis Date Noted   Right inguinal hernia 11/02/2018   GERD (gastroesophageal reflux disease) 05/08/2018   Back pain without sciatica 08/15/2014   Insomnia 08/15/2014   Allergic rhinitis 07/17/2013    Past Surgical History:  Procedure Laterality Date   TONSILECTOMY, ADENOIDECTOMY, BILATERAL MYRINGOTOMY AND TUBES     TONSILLECTOMY     XI ROBOTIC ASSISTED INGUINAL HERNIA REPAIR WITH MESH Right 04/30/2019   Procedure: XI ROBOTIC ASSISTED INGUINAL HERNIA REPAIR WITH MESH;  Surgeon: Leafy Ro, MD;  Location: [facility] ORS;  Service: General;  Laterality: Right;       Home Medications    Prior to Admission medications   Medication Sig Start Date End Date Taking? Authorizing Provider  HYDROcodone-acetaminophen (NORCO/VICODIN) 5-325 MG tablet Take 1 tablet by mouth every 6 (six) hours as needed for moderate pain. Patient not taking: Reported on 04/03/2023 04/30/19   Leafy Ro, MD  methylcellulose oral powder Take 1 packet by mouth daily.    [provider]  pantoprazole (PROTONIX) 40 MG tablet Take 40 mg by mouth at bedtime.  09/30/17   [provider]    Family History Family  History  Problem Relation Age of Onset   Breast cancer Mother    Diabetes Mother    Severe combined immunodeficiency Father     Social History Social History   Tobacco Use   Smoking status: Former    Current packs/day: 0.00    Average packs/day: 1 pack/day for 6.0 years (6.0 ttl pk-yrs)    Types: Cigarettes    Start date: 34    Quit date: 2004    Years since quitting: 21.0   Smokeless tobacco: Current    Types: Chew  Vaping Use   Vaping status: Never Used  Substance Use Topics   Alcohol use: Yes    Comment: OCC   Drug use: No     Allergies   Patient has no known allergies.   Review of Systems Review of Systems   Physical Exam Triage Vital Signs ED Triage Vitals  Encounter Vitals Group     BP 04/03/23 1130 (!) 143/99     Systolic BP Percentile --      Diastolic BP Percentile --      Pulse Rate 04/03/23 1130 94     Resp 04/03/23 1130 20     Temp --      Temp Source 04/03/23 1130 Oral     SpO2 04/03/23 1130 100 %     Weight --      Height --      Head Circumference --      Peak Flow --  Pain Score 04/03/23 1127 0     Pain Loc --      Pain Education --      Exclude from Growth Chart --    No data found.  Updated Vital Signs BP (!) 137/91 (BP Location: Left Arm) Comment (BP Location): arm at heart level  Pulse 94   Resp 20   SpO2 100%   Visual Acuity Right Eye Distance:   Left Eye Distance:   Bilateral Distance:    Right Eye Near:   Left Eye Near:    Bilateral Near:     Physical Exam Constitutional:      Appearance: Normal appearance.  HENT:     Head: Normocephalic.     Right Ear: Tympanic membrane, ear canal and external ear normal.     Left Ear: Tympanic membrane, ear canal and external ear normal.     Nose: Congestion present. No rhinorrhea.     Mouth/Throat:     Pharynx: No oropharyngeal exudate or posterior oropharyngeal erythema.  Eyes:     Extraocular Movements: Extraocular movements intact.  Cardiovascular:     Rate and  Rhythm: Normal rate and regular rhythm.     Pulses: Normal pulses.     Heart sounds: Normal heart sounds.  Pulmonary:     Effort: Pulmonary effort is normal.     Breath sounds: Normal breath sounds.  Skin:    General: Skin is warm and dry.  Neurological:     Mental Status: He is alert and oriented to person, place, and time. Mental status is at baseline.      UC Treatments / Results  Labs (all labs ordered are listed, but only abnormal results are displayed) Labs Reviewed  POCT RAPID STREP A (OFFICE)    EKG   Radiology No results found.  Procedures Procedures (including critical care time)  Medications Ordered in UC Medications - No data to display  Initial Impression / Assessment and Plan / UC Course  I have reviewed the triage vital signs and the nursing notes.  Pertinent labs & imaging results that were available during my care of the patient were reviewed by me and considered in my medical decision making (see chart for details).  Viral URI  Patient is in no signs of distress nor toxic appearing.  Vital signs are stable.  Low suspicion for pneumonia, pneumothorax or bronchitis and therefore will defer imaging.  Strep test negative.May use additional over-the-counter medications as needed for supportive care.  May follow-up with urgent care as needed if symptoms persist or worsen.    Final Clinical Impressions(s) / UC Diagnoses   Final diagnoses:  Viral URI     Discharge Instructions      Your symptoms today are most likely being caused by a virus and should steadily improve in time it can take up to 7 to 10 days before you truly start to see a turnaround however things will get better  Strep negative for bacteria     You can take Tylenol and/or Ibuprofen as needed for fever reduction and pain relief.   For cough: honey 1/2 to 1 teaspoon (you can dilute the honey in water or another fluid).  You can also use guaifenesin and dextromethorphan for cough. You  can use a humidifier for chest congestion and cough.  If you don't have a humidifier, you can sit in the bathroom with the hot shower running.      For sore throat: try warm salt water gargles, cepacol lozenges,  throat spray, warm tea or water with lemon/honey, popsicles or ice, or OTC cold relief medicine for throat discomfort.   For congestion: take a daily anti-histamine like Zyrtec, Claritin, and a oral decongestant, such as pseudoephedrine.  You can also use Flonase 1-2 sprays in each nostril daily.   It is important to stay hydrated: drink plenty of fluids (water, gatorade/powerade/pedialyte, juices, or teas) to keep your throat moisturized and help further relieve irritation/discomfort.    ED Prescriptions   None    PDMP not reviewed this encounter.   Valinda Hoar, NP 04/03/23 1226
